# Patient Record
Sex: Female | Born: 1945 | Race: Black or African American | Hispanic: No | Marital: Single | Smoking: Current some day smoker
Health system: Southern US, Community
[De-identification: ages and names within clinical notes are randomized; demographics above are authoritative.]

## PROBLEM LIST (undated history)

## (undated) DIAGNOSIS — E785 Hyperlipidemia, unspecified: Secondary | ICD-10-CM

## (undated) DIAGNOSIS — J449 Chronic obstructive pulmonary disease, unspecified: Secondary | ICD-10-CM

## (undated) DIAGNOSIS — E559 Vitamin D deficiency, unspecified: Secondary | ICD-10-CM

## (undated) DIAGNOSIS — F0390 Unspecified dementia without behavioral disturbance: Secondary | ICD-10-CM

## (undated) HISTORY — PX: NO PAST SURGERIES: SHX2092

---

## 2016-06-26 ENCOUNTER — Emergency Department: Payer: Medicare Other

## 2016-06-26 ENCOUNTER — Emergency Department
Admission: EM | Admit: 2016-06-26 | Discharge: 2016-06-26 | Disposition: A | Discharge status: Home / Self Care | Payer: Medicare Other | Attending: Emergency Medicine | Admitting: Emergency Medicine

## 2016-06-26 ENCOUNTER — Encounter: Payer: Self-pay | Admitting: Emergency Medicine

## 2016-06-26 DIAGNOSIS — R410 Disorientation, unspecified: Secondary | ICD-10-CM | POA: Insufficient documentation

## 2016-06-26 DIAGNOSIS — N390 Urinary tract infection, site not specified: Secondary | ICD-10-CM

## 2016-06-26 DIAGNOSIS — F17200 Nicotine dependence, unspecified, uncomplicated: Secondary | ICD-10-CM | POA: Insufficient documentation

## 2016-06-26 DIAGNOSIS — Y939 Activity, unspecified: Secondary | ICD-10-CM | POA: Insufficient documentation

## 2016-06-26 DIAGNOSIS — S0081XA Abrasion of other part of head, initial encounter: Secondary | ICD-10-CM | POA: Insufficient documentation

## 2016-06-26 DIAGNOSIS — W19XXXA Unspecified fall, initial encounter: Secondary | ICD-10-CM | POA: Diagnosis not present

## 2016-06-26 DIAGNOSIS — Z7982 Long term (current) use of aspirin: Secondary | ICD-10-CM | POA: Insufficient documentation

## 2016-06-26 DIAGNOSIS — Z79899 Other long term (current) drug therapy: Secondary | ICD-10-CM | POA: Insufficient documentation

## 2016-06-26 DIAGNOSIS — Y9289 Other specified places as the place of occurrence of the external cause: Secondary | ICD-10-CM | POA: Insufficient documentation

## 2016-06-26 DIAGNOSIS — Y999 Unspecified external cause status: Secondary | ICD-10-CM | POA: Insufficient documentation

## 2016-06-26 DIAGNOSIS — S0993XA Unspecified injury of face, initial encounter: Secondary | ICD-10-CM | POA: Diagnosis present

## 2016-06-26 LAB — URINALYSIS COMPLETE WITH MICROSCOPIC (ARMC ONLY)
Bilirubin Urine: NEGATIVE
Glucose, UA: NEGATIVE mg/dL
HGB URINE DIPSTICK: NEGATIVE
Ketones, ur: NEGATIVE mg/dL
Nitrite: NEGATIVE
PH: 5 (ref 5.0–8.0)
PROTEIN: 30 mg/dL — AB
Specific Gravity, Urine: 1.012 (ref 1.005–1.030)

## 2016-06-26 LAB — BASIC METABOLIC PANEL
ANION GAP: 6 (ref 5–15)
BUN: 14 mg/dL (ref 6–20)
CALCIUM: 10.4 mg/dL — AB (ref 8.9–10.3)
CO2: 23 mmol/L (ref 22–32)
CREATININE: 1.22 mg/dL — AB (ref 0.44–1.00)
Chloride: 112 mmol/L — ABNORMAL HIGH (ref 101–111)
GFR, EST AFRICAN AMERICAN: 50 mL/min — AB (ref >60)
GFR, EST NON AFRICAN AMERICAN: 43 mL/min — AB (ref >60)
Glucose, Bld: 113 mg/dL — ABNORMAL HIGH (ref 65–99)
Potassium: 3.1 mmol/L — ABNORMAL LOW (ref 3.5–5.1)
SODIUM: 141 mmol/L (ref 135–145)

## 2016-06-26 LAB — CBC WITH DIFFERENTIAL/PLATELET
BASOS ABS: 0.1 10*3/uL (ref 0–0.1)
BASOS PCT: 2 %
EOS ABS: 0.2 10*3/uL (ref 0–0.7)
Eosinophils Relative: 2 %
HCT: 32.4 % — ABNORMAL LOW (ref 35.0–47.0)
HEMOGLOBIN: 10.9 g/dL — AB (ref 12.0–16.0)
Lymphocytes Relative: 41 %
Lymphs Abs: 3 10*3/uL (ref 1.0–3.6)
MCH: 27.8 pg (ref 26.0–34.0)
MCHC: 33.5 g/dL (ref 32.0–36.0)
MCV: 82.7 fL (ref 80.0–100.0)
MONOS PCT: 5 %
Monocytes Absolute: 0.4 10*3/uL (ref 0.2–0.9)
NEUTROS PCT: 50 %
Neutro Abs: 3.6 10*3/uL (ref 1.4–6.5)
Platelets: 346 10*3/uL (ref 150–440)
RBC: 3.92 MIL/uL (ref 3.80–5.20)
RDW: 16 % — AB (ref 11.5–14.5)
WBC: 7.3 10*3/uL (ref 3.6–11.0)

## 2016-06-26 LAB — TROPONIN I

## 2016-06-26 MED ORDER — FOSFOMYCIN TROMETHAMINE 3 G PO PACK: 3.0000 g | PACK | Freq: Once | ORAL | Status: DC

## 2016-06-26 MED ORDER — FOSFOMYCIN TROMETHAMINE 3 G PO PACK
3.0000 g | PACK | ORAL | Status: AC
  Administered 2016-06-26: 3 g via ORAL
  Filled 2016-06-26: qty 3

## 2016-06-26 NOTE — ED Provider Notes (Signed)
[facility] Emergency Department Provider Note   ____________________________________________  Time seen: Approximately 2:50pm I have reviewed the triage vital signs and the triage nursing note.  HISTORY  Chief Complaint Fall   Historian Limited Patient has dementia Ems report from nursing home memory care  HPI Candice Randall is a 70 y.o. female was sent in by EMS from memory care after an unwitnessed fall. She was reportedly found up against a wall. She was somewhat confused, although she does have a history of underlying dementia. EMS reported that she seemed a little slow to answer questions as well as confused that seemed to improve by the time she got to the ED. She has a abrasion over the bridge of her nose but does not seem to be complaining of any pain or reported to have any additional external signs of trauma.  No reported recent illnesses.    History reviewed. No pertinent past medical history. Dementia  There are no active problems to display for this patient.   History reviewed. No pertinent past surgical history.  No current outpatient prescriptions on file.  Allergies Review of patient's allergies indicates no known allergies.  History reviewed. No pertinent family history.  Social History Social History  Substance Use Topics  . Smoking status: Current Some Day Smoker  . Smokeless tobacco: None  . Alcohol Use: No    Review of Systems Limited due to patient's dementia However she denies headache, neck pain, extremity pain, vomiting, chest pain, trouble breathing ____________________________________________   PHYSICAL EXAM:  VITAL SIGNS: ED Triage Vitals  Enc Vitals Group     BP --      Pulse --      Resp --      Temp --      Temp src --      SpO2 --      Weight --      Height --      Head Cir --      Peak Flow --      Pain Score --      Pain Loc --      Pain Edu? --      Excl. in GC? --      Constitutional:  Alert and Cooperative and pleasant but disoriented and poor historian. Well appearing and in no distress. HEENT   Head: Normocephalic and atraumatic.      Eyes: Conjunctivae are normal. PERRL. Normal extraocular movements.      Ears:         Nose: No congestion/rhinnorhea. No nasal septal hematoma. Abrasion over the bridge of the nose.   Mouth/Throat: Mucous membranes are moist.   Neck: No stridor. No posterior midline C-spine step-offs or tenderness Cardiovascular/Chest: Normal rate, regular rhythm.  No murmurs, rubs, or gallops. Respiratory: Normal respiratory effort without tachypnea nor retractions. Breath sounds are clear and equal bilaterally. No wheezes/rales/rhonchi. Gastrointestinal: Soft. No distention, no guarding, no rebound. Nontender.    Genitourinary/rectal:Deferred Musculoskeletal: Pelvis stable and nontender. Nontender with normal range of motion in all extremities. No joint effusions.  No lower extremity tenderness.  No edema. Lipoma left forearm Neurologic:  Alert and conversant but disoriented. No slurred speech or facial droop. No gross or focal neurologic deficits are appreciated. Skin:  Skin is warm, dry and intact. No rash noted.  ____________________________________________   EKG I, Governor Rooksebecca Aidin Doane, MD, the attending physician have personally viewed and interpreted all ECGs.   75 bpm. Normal sinus rhythm. Narrow QRS. Normal axis. Normal  ST and T-wave ____________________________________________  LABS (pertinent positives/negatives)  Labs Reviewed  URINALYSIS COMPLETEWITH MICROSCOPIC ([facility] ONLY)  BASIC METABOLIC PANEL  CBC WITH DIFFERENTIAL/PLATELET  TROPONIN I   All are pending ____________________________________________  RADIOLOGY All Xrays were viewed by me. Imaging interpreted by Radiologist.  CT head and C-spine noncontrast: Pending __________________________________________  PROCEDURES  Procedure(s) performed: None  Critical Care  performed: None  ____________________________________________   ED COURSE / ASSESSMENT AND PLAN  Pertinent labs & imaging results that were available during my care of the patient were reviewed by me and considered in my medical decision making (see chart for details).   This patient was brought in after an unwitnessed fall and she does have dementia, and although she does not have a complaint of neck pain I placed her in a neck brace and we'll CT her head and neck.  It sounds like she was a little bit decreased with her alertness when EMS got to her raising question for potential syncope prior to fall or concussion. In either case she does not appear to have any focal neurologic deficits consistent with stroke or TIA.  From a trauma perspective, she does not appear to have evidence of any additional extremity injuries or chest, abdomen or pelvis trauma.   I will send screening laboratory studies, EKG, and urinalysis given unknown cause of fall and it was unwitnessed so potentially could've just been off balance or tripping.  Patient care transferred to Dr. Fanny BienQuale at shift change for follow-up of laboratory studies, and imaging and then disposition which I suspect may be back home.    CONSULTATIONS:   None   Patient / Family / Caregiver informed of clinical course, medical decision-making process, and agree with plan.     ___________________________________________   FINAL CLINICAL IMPRESSION(S) / ED DIAGNOSES   Final diagnoses:  Facial abrasion, initial encounter  Fall, initial encounter              Note: This dictation was prepared with Nurse, children'sDragon dictation. Any transcriptional errors that result from this process are unintentional   Governor Rooksebecca Jachai Okazaki, MD 06/26/16 1512

## 2016-06-26 NOTE — ED Provider Notes (Signed)
Patient sitting up, conversant with family. CTs demonstrate no acute abnormality. Patient awake and alert, no distress very pleasant denying any concerns right now.  Labs do reveal what appears to be a mild urinary tract infection. We will treat this with fosfomycin, and she'll be returning to her skilled nursing/assisted living facility. Family comfortable taking her home.  Return precautions and treatment recommendations and follow-up discussed with the patient who is agreeable with the plan.  CT Head Wo Contrast (Final result) Result time: 06/26/16 16:13:12   Final result by Rad Results In Interface (06/26/16 16:13:12)   Narrative:   CLINICAL DATA: Unwitnessed fall from standing. Confusion. Abrasions to the bridge of nose  EXAM: CT HEAD WITHOUT CONTRAST  CT CERVICAL SPINE WITHOUT CONTRAST  TECHNIQUE: Multidetector CT imaging of the head and cervical spine was performed following the standard protocol without intravenous contrast. Multiplanar CT image reconstructions of the cervical spine were also generated.  COMPARISON: None.  FINDINGS: CT HEAD FINDINGS  There is no evidence for acute hemorrhage, hydrocephalus, mass lesion, or abnormal extra-axial fluid collection. No definite CT evidence for acute infarction. Diffuse loss of parenchymal volume is consistent with atrophy. Patchy low attenuation in the deep hemispheric and periventricular white matter is nonspecific, but likely reflects chronic microvascular ischemic demyelination. The visualized paranasal sinuses and mastoid air cells are clear. No evidence for skull fracture.  CT CERVICAL SPINE FINDINGS  Imaging is obtained from the skullbase through the T2 vertebral body. Patient with incomplete posterior arch at C1. No evidence of fracture. Loss of disc height with endplate degeneration is seen at C4-5, C5-6, and C6-7. Trace anterolisthesis of C5 on 6 is compatible with the degree of facet disease at this level,  left greater than right. There is prominent left-sided uncinate spurring at C4-5 and C5-6. Reversal of the normal cervical lordosis is evident. No prevertebral soft tissue edema. 13 mm exophytic thyroid nodule identified posterior right thyroid lobe.  IMPRESSION: 1. No acute intracranial abnormality. 2. Atrophy with chronic small vessel white matter ischemic disease. 3. Degenerative changes in the midcervical spine without evidence for cervical spine fracture. 4. 13 mm posterior right thyroid nodule may not be clinically relevant in this individual. Thyroid ultrasound could be used to further evaluate as clinically warranted.   Electronically Signed By: Kennith CenterEric Mansell M.D. On: 06/26/2016 16:13          CT Cervical Spine Wo Contrast (Final result) Result time: 06/26/16 16:13:12   Final result by Rad Results In Interface (06/26/16 16:13:12)   Narrative:   CLINICAL DATA: Unwitnessed fall from standing. Confusion. Abrasions to the bridge of nose  EXAM: CT HEAD WITHOUT CONTRAST  CT CERVICAL SPINE WITHOUT CONTRAST  TECHNIQUE: Multidetector CT imaging of the head and cervical spine was performed following the standard protocol without intravenous contrast. Multiplanar CT image reconstructions of the cervical spine were also generated.  COMPARISON: None.  FINDINGS: CT HEAD FINDINGS  There is no evidence for acute hemorrhage, hydrocephalus, mass lesion, or abnormal extra-axial fluid collection. No definite CT evidence for acute infarction. Diffuse loss of parenchymal volume is consistent with atrophy. Patchy low attenuation in the deep hemispheric and periventricular white matter is nonspecific, but likely reflects chronic microvascular ischemic demyelination. The visualized paranasal sinuses and mastoid air cells are clear. No evidence for skull fracture.  CT CERVICAL SPINE FINDINGS  Imaging is obtained from the skullbase through the T2 vertebral body.  Patient with incomplete posterior arch at C1. No evidence of fracture. Loss of disc height with endplate  degeneration is seen at C4-5, C5-6, and C6-7. Trace anterolisthesis of C5 on 6 is compatible with the degree of facet disease at this level, left greater than right. There is prominent left-sided uncinate spurring at C4-5 and C5-6. Reversal of the normal cervical lordosis is evident. No prevertebral soft tissue edema. 13 mm exophytic thyroid nodule identified posterior right thyroid lobe.  IMPRESSION: 1. No acute intracranial abnormality. 2. Atrophy with chronic small vessel white matter ischemic disease. 3. Degenerative changes in the midcervical spine without evidence for cervical spine fracture. 4. 13 mm posterior right thyroid nodule may not be clinically relevant in this individual. Thyroid ultrasound could be used to further evaluate as clinically warranted.   Electronically Signed By: Kennith CenterEric Mansell M.D. On: 06/26/2016 16:13       Discussed with the patient and her family also incidental finding of a thyroid nodule. She has a history of previous thyroid disease, and family and the patient are agreeable to discussing and following up with her primary care doctor regarding this.  Patient will take her second dose of fosfomycin in 3 days.  She'll follow up closely with her primary care.  Return precautions and treatment recommendations and follow-up discussed with the patient who is agreeable with the plan.   Sharyn CreamerMark Quale, MD 06/26/16 [phone]

## 2016-06-26 NOTE — ED Notes (Signed)
Pt with unwitnessed fall. Per ems pt confused after fall. Abrasion bridge of nose, pt c/o burning. Pt from memory unit [facility]

## 2016-06-26 NOTE — Discharge Instructions (Signed)
Head Injury, Adult You have a head injury. Headaches and throwing up (vomiting) are common after a head injury. It should be easy to wake up from sleeping. Sometimes you must stay in the hospital. Most problems happen within the first 24 hours. Side effects may occur up to 7-10 days after the injury.  WHAT ARE THE TYPES OF HEAD INJURIES? Head injuries can be as minor as a bump. Some head injuries can be more severe. More severe head injuries include:  A jarring injury to the brain (concussion).  A bruise of the brain (contusion). This mean there is bleeding in the brain that can cause swelling.  A cracked skull (skull fracture).  Bleeding in the brain that collects, clots, and forms a bump (hematoma). WHEN SHOULD I GET HELP RIGHT AWAY?   You are confused or sleepy.  You cannot be woken up.  You feel sick to your stomach (nauseous) or keep throwing up (vomiting).  Your dizziness or unsteadiness is getting worse.  You have very bad, lasting headaches that are not helped by medicine. Take medicines only as told by your doctor.  You cannot use your arms or legs like normal.  You cannot walk.  You notice changes in the black spots in the center of the colored part of your eye (pupil).  You have clear or bloody fluid coming from your nose or ears.  You have trouble seeing. During the next 24 hours after the injury, you must stay with someone who can watch you. This person should get help right away (call 911 in the [place] if you start to shake and are not able to control it (have seizures), you pass out, or you are unable to wake up. HOW CAN I PREVENT A HEAD INJURY IN THE FUTURE?  Wear seat belts.  Wear a helmet while bike riding and playing sports like football.  Stay away from dangerous activities around the house. WHEN CAN I RETURN TO NORMAL ACTIVITIES AND ATHLETICS? See your doctor before doing these activities. You should not do normal activities or play contact sports until 1  week after the following symptoms have stopped:  Headache that does not go away.  Dizziness.  Poor attention.  Confusion.  Memory problems.  Sickness to your stomach or throwing up.  Tiredness.  Fussiness.  Bothered by bright lights or loud noises.  Anxiousness or depression.  Restless sleep. MAKE SURE YOU:   Understand these instructions.  Will watch your condition.  Will get help right away if you are not doing well or get worse.   This information is not intended to replace advice given to you by your health care provider. Make sure you discuss any questions you have with your health care provider.   Document Released: 11/24/2008 Document Revised: 01/02/2015 Document Reviewed: 08/19/2013 Elsevier Interactive Patient Education 2016 Elsevier Inc.  Urinary Tract Infection Urinary tract infections (UTIs) can develop anywhere along your urinary tract. Your urinary tract is your body's drainage system for removing wastes and extra water. Your urinary tract includes two kidneys, two ureters, a bladder, and a urethra. Your kidneys are a pair of bean-shaped organs. Each kidney is about the size of your fist. They are located below your ribs, one on each side of your spine. CAUSES Infections are caused by microbes, which are microscopic organisms, including fungi, viruses, and bacteria. These organisms are so small that they can only be seen through a microscope. Bacteria are the microbes that most commonly cause UTIs. SYMPTOMS  Symptoms  of UTIs may vary by age and gender of the patient and by the location of the infection. Symptoms in young women typically include a frequent and intense urge to urinate and a painful, burning feeling in the bladder or urethra during urination. Older women and men are more likely to be tired, shaky, and weak and have muscle aches and abdominal pain. A fever may mean the infection is in your kidneys. Other symptoms of a kidney infection include pain  in your back or sides below the ribs, nausea, and vomiting. DIAGNOSIS To diagnose a UTI, your caregiver will ask you about your symptoms. Your caregiver will also ask you to provide a urine sample. The urine sample will be tested for bacteria and white blood cells. White blood cells are made by your body to help fight infection. TREATMENT  Typically, UTIs can be treated with medication. Because most UTIs are caused by a bacterial infection, they usually can be treated with the use of antibiotics. The choice of antibiotic and length of treatment depend on your symptoms and the type of bacteria causing your infection. HOME CARE INSTRUCTIONS  If you were prescribed antibiotics, take them exactly as your caregiver instructs you. Finish the medication even if you feel better after you have only taken some of the medication.  Drink enough water and fluids to keep your urine clear or pale yellow.  Avoid caffeine, tea, and carbonated beverages. They tend to irritate your bladder.  Empty your bladder often. Avoid holding urine for long periods of time.  Empty your bladder before and after sexual intercourse.  After a bowel movement, women should cleanse from front to back. Use each tissue only once. SEEK MEDICAL CARE IF:   You have back pain.  You develop a fever.  Your symptoms do not begin to resolve within 3 days. SEEK IMMEDIATE MEDICAL CARE IF:   You have severe back pain or lower abdominal pain.  You develop chills.  You have nausea or vomiting.  You have continued burning or discomfort with urination. MAKE SURE YOU:   Understand these instructions.  Will watch your condition.  Will get help right away if you are not doing well or get worse.   This information is not intended to replace advice given to you by your health care provider. Make sure you discuss any questions you have with your health care provider.   Document Released: 09/21/2005 Document Revised: 09/02/2015  Document Reviewed: 01/20/2012 Elsevier Interactive Patient Education [employer]2016 Elsevier Inc.

## 2016-07-28 ENCOUNTER — Emergency Department: Payer: Medicare Other

## 2016-07-28 ENCOUNTER — Encounter: Payer: Self-pay | Admitting: Emergency Medicine

## 2016-07-28 ENCOUNTER — Observation Stay
Admission: EM | Admit: 2016-07-28 | Discharge: 2016-07-29 | Disposition: A | Discharge status: Skilled Nursing Facility | Payer: Medicare Other | Attending: Internal Medicine | Admitting: Internal Medicine

## 2016-07-28 DIAGNOSIS — J449 Chronic obstructive pulmonary disease, unspecified: Secondary | ICD-10-CM | POA: Diagnosis not present

## 2016-07-28 DIAGNOSIS — I493 Ventricular premature depolarization: Secondary | ICD-10-CM | POA: Insufficient documentation

## 2016-07-28 DIAGNOSIS — I10 Essential (primary) hypertension: Secondary | ICD-10-CM | POA: Diagnosis not present

## 2016-07-28 DIAGNOSIS — E559 Vitamin D deficiency, unspecified: Secondary | ICD-10-CM | POA: Diagnosis not present

## 2016-07-28 DIAGNOSIS — Z79899 Other long term (current) drug therapy: Secondary | ICD-10-CM | POA: Diagnosis not present

## 2016-07-28 DIAGNOSIS — E872 Acidosis, unspecified: Secondary | ICD-10-CM

## 2016-07-28 DIAGNOSIS — R5383 Other fatigue: Secondary | ICD-10-CM | POA: Insufficient documentation

## 2016-07-28 DIAGNOSIS — Z7982 Long term (current) use of aspirin: Secondary | ICD-10-CM | POA: Insufficient documentation

## 2016-07-28 DIAGNOSIS — E785 Hyperlipidemia, unspecified: Secondary | ICD-10-CM | POA: Diagnosis not present

## 2016-07-28 DIAGNOSIS — E860 Dehydration: Secondary | ICD-10-CM | POA: Diagnosis not present

## 2016-07-28 DIAGNOSIS — F0390 Unspecified dementia without behavioral disturbance: Secondary | ICD-10-CM | POA: Insufficient documentation

## 2016-07-28 DIAGNOSIS — I700 Atherosclerosis of aorta: Secondary | ICD-10-CM | POA: Insufficient documentation

## 2016-07-28 DIAGNOSIS — N179 Acute kidney failure, unspecified: Secondary | ICD-10-CM | POA: Diagnosis not present

## 2016-07-28 DIAGNOSIS — F17200 Nicotine dependence, unspecified, uncomplicated: Secondary | ICD-10-CM | POA: Diagnosis not present

## 2016-07-28 DIAGNOSIS — I950 Idiopathic hypotension: Secondary | ICD-10-CM | POA: Insufficient documentation

## 2016-07-28 HISTORY — DX: Chronic obstructive pulmonary disease, unspecified: J44.9

## 2016-07-28 HISTORY — DX: Hyperlipidemia, unspecified: E78.5

## 2016-07-28 HISTORY — DX: Vitamin D deficiency, unspecified: E55.9

## 2016-07-28 HISTORY — DX: Unspecified dementia, unspecified severity, without behavioral disturbance, psychotic disturbance, mood disturbance, and anxiety: F03.90

## 2016-07-28 LAB — CBC WITH DIFFERENTIAL/PLATELET
BASOS ABS: 0 10*3/uL (ref 0–0.1)
Basophils Relative: 0 %
Eosinophils Absolute: 0 10*3/uL (ref 0–0.7)
Eosinophils Relative: 1 %
HCT: 37.8 % (ref 35.0–47.0)
Hemoglobin: 12.5 g/dL (ref 12.0–16.0)
LYMPHS ABS: 1.8 10*3/uL (ref 1.0–3.6)
LYMPHS PCT: 26 %
MCH: 27 pg (ref 26.0–34.0)
MCHC: 33 g/dL (ref 32.0–36.0)
MCV: 81.8 fL (ref 80.0–100.0)
MONO ABS: 0.8 10*3/uL (ref 0.2–0.9)
Monocytes Relative: 11 %
NEUTROS ABS: 4.5 10*3/uL (ref 1.4–6.5)
Neutrophils Relative %: 62 %
Platelets: 319 10*3/uL (ref 150–440)
RBC: 4.63 MIL/uL (ref 3.80–5.20)
RDW: 16.2 % — AB (ref 11.5–14.5)
WBC: 7.1 10*3/uL (ref 3.6–11.0)

## 2016-07-28 LAB — LACTIC ACID, PLASMA
LACTIC ACID, VENOUS: 0.8 mmol/L (ref 0.5–1.9)
Lactic Acid, Venous: 1.9 mmol/L (ref 0.5–1.9)

## 2016-07-28 LAB — COMPREHENSIVE METABOLIC PANEL
ALBUMIN: 3.7 g/dL (ref 3.5–5.0)
ALT: 12 U/L — ABNORMAL LOW (ref 14–54)
ANION GAP: 11 (ref 5–15)
AST: 24 U/L (ref 15–41)
Alkaline Phosphatase: 144 U/L — ABNORMAL HIGH (ref 38–126)
BUN: 61 mg/dL — ABNORMAL HIGH (ref 6–20)
CHLORIDE: 112 mmol/L — AB (ref 101–111)
CO2: 18 mmol/L — AB (ref 22–32)
Calcium: 10.3 mg/dL (ref 8.9–10.3)
Creatinine, Ser: 2.03 mg/dL — ABNORMAL HIGH (ref 0.44–1.00)
GFR calc Af Amer: 27 mL/min — ABNORMAL LOW (ref >60)
GFR calc non Af Amer: 23 mL/min — ABNORMAL LOW (ref >60)
GLUCOSE: 94 mg/dL (ref 65–99)
POTASSIUM: 4.8 mmol/L (ref 3.5–5.1)
Sodium: 141 mmol/L (ref 135–145)
TOTAL PROTEIN: 8.2 g/dL — AB (ref 6.5–8.1)
Total Bilirubin: 0.7 mg/dL (ref 0.3–1.2)

## 2016-07-28 LAB — URINALYSIS COMPLETE WITH MICROSCOPIC (ARMC ONLY)
Bilirubin Urine: NEGATIVE
GLUCOSE, UA: NEGATIVE mg/dL
HGB URINE DIPSTICK: NEGATIVE
Ketones, ur: NEGATIVE mg/dL
Leukocytes, UA: NEGATIVE
NITRITE: NEGATIVE
PROTEIN: NEGATIVE mg/dL
Specific Gravity, Urine: 1.012 (ref 1.005–1.030)
pH: 5 (ref 5.0–8.0)

## 2016-07-28 MED ORDER — ENOXAPARIN SODIUM 40 MG/0.4ML ~~LOC~~ SOLN: 40.0000 mg | SUBCUTANEOUS | Status: DC

## 2016-07-28 MED ORDER — DOCUSATE SODIUM 100 MG PO CAPS
100.0000 mg | ORAL_CAPSULE | Freq: Two times a day (BID) | ORAL | Status: DC
  Administered 2016-07-28: 100 mg via ORAL
  Filled 2016-07-28: qty 1

## 2016-07-28 MED ORDER — ONDANSETRON HCL 4 MG/2ML IJ SOLN: 4.0000 mg | Freq: Four times a day (QID) | INTRAMUSCULAR | Status: DC | PRN

## 2016-07-28 MED ORDER — ACETAMINOPHEN 325 MG PO TABS: 650.0000 mg | ORAL_TABLET | Freq: Four times a day (QID) | ORAL | Status: DC | PRN

## 2016-07-28 MED ORDER — ASPIRIN EC 81 MG PO TBEC: 81.0000 mg | DELAYED_RELEASE_TABLET | Freq: Every day | ORAL | Status: DC

## 2016-07-28 MED ORDER — ACETAMINOPHEN 650 MG RE SUPP: 650.0000 mg | Freq: Four times a day (QID) | RECTAL | Status: DC | PRN

## 2016-07-28 MED ORDER — ALBUTEROL SULFATE (2.5 MG/3ML) 0.083% IN NEBU: 2.5000 mg | INHALATION_SOLUTION | RESPIRATORY_TRACT | Status: DC | PRN

## 2016-07-28 MED ORDER — ONDANSETRON HCL 4 MG PO TABS: 4.0000 mg | ORAL_TABLET | Freq: Four times a day (QID) | ORAL | Status: DC | PRN

## 2016-07-28 MED ORDER — SODIUM CHLORIDE 0.9 % IV SOLN
INTRAVENOUS | Status: DC
  Administered 2016-07-28 – 2016-07-29 (×2): via INTRAVENOUS

## 2016-07-28 MED ORDER — PRAVASTATIN SODIUM 10 MG PO TABS
10.0000 mg | ORAL_TABLET | Freq: Every day | ORAL | Status: DC
  Administered 2016-07-28: 10 mg via ORAL
  Filled 2016-07-28: qty 1

## 2016-07-28 MED ORDER — SODIUM CHLORIDE 0.9 % IV BOLUS (SEPSIS)
1000.0000 mL | Freq: Once | INTRAVENOUS | Status: AC
  Administered 2016-07-28: 1000 mL via INTRAVENOUS

## 2016-07-28 MED ORDER — ENOXAPARIN SODIUM 40 MG/0.4ML ~~LOC~~ SOLN: 30.0000 mg | SUBCUTANEOUS | Status: DC

## 2016-07-28 MED ORDER — FOLIC ACID 1 MG PO TABS: 1.0000 mg | ORAL_TABLET | Freq: Every day | ORAL | Status: DC

## 2016-07-28 MED ORDER — POLYETHYLENE GLYCOL 3350 17 G PO PACK: 17.0000 g | PACK | Freq: Every day | ORAL | Status: DC | PRN

## 2016-07-28 MED ORDER — OXYCODONE HCL 5 MG PO TABS: 5.0000 mg | ORAL_TABLET | ORAL | Status: DC | PRN

## 2016-07-28 NOTE — Progress Notes (Signed)
Patient was admitted to room 145 from ER via cart and nurse tech from ER.  2 IVs in place. Tele and IV fluids started. Patient is alert to self only, answered some questions. History of Dementia. Bed alarm on for safety.

## 2016-07-28 NOTE — ED Notes (Signed)
Carollee Herter, RN to bedside to attempt an IV at this time. Will continue to monitor for further patient needs.

## 2016-07-28 NOTE — ED Notes (Signed)
Report called to Jamie, RN

## 2016-07-28 NOTE — ED Provider Notes (Signed)
[facility] Emergency Department Provider Note    First MD Initiated Contact with Patient 07/28/16 1200     (approximate)  I have reviewed the triage vital signs and the nursing notes.   HISTORY  Chief Complaint Hypotension    HPI Candice Randall is a 70 y.o. female  who is pleasantly demented presenting from nursing facility memory care center with report of dehydration and low blood pressure. Patient poorly had decreased by mouth intake. Staff physician saw her today and was also concern for urinary tract infection. History is limited due to her underlying dementia at this time patient responds to all questions with " Dr. Roxan Hockey."      Past Medical History:  Diagnosis Date  . COPD (chronic obstructive pulmonary disease) (HCC)   . Dementia   . Hyperlipidemia   . Vitamin D deficiency     There are no active problems to display for this patient.   History reviewed. No pertinent surgical history.  Prior to Admission medications   Medication Sig Start Date End Date Taking? Authorizing Provider  aspirin EC 81 MG tablet Take 81 mg by mouth daily.    Historical Provider, MD  cholecalciferol (VITAMIN D) 1000 units tablet Take 1,000 Units by mouth daily.    Historical Provider, MD  folic acid (FOLVITE) 1 MG tablet Take 1 mg by mouth daily.    Historical Provider, MD  fosfomycin (MONUROL) 3 g PACK Take 3 g by mouth once. Please mix in 8 oz of water, take by mouth once 06/26/16   Sharyn Creamer, MD  pravastatin (PRAVACHOL) 10 MG tablet Take 10 mg by mouth at bedtime.    Historical Provider, MD    Allergies Review of patient's allergies indicates no known allergies.  History reviewed. No pertinent family history.  Social History Social History  Substance Use Topics  . Smoking status: Current Some Day Smoker  . Smokeless tobacco: Never Used  . Alcohol use No    Review of Systems Unable to obtain due to AMS,  dementia ____________________________________________   PHYSICAL EXAM:  VITAL SIGNS: Vitals:   07/28/16 1213  BP: 105/77  Pulse: 98  Resp: (!) 21  Temp: 97.6 F (36.4 C)    Constitutional: Alert and disoriented x3. Chronically ill appearing. Eyes: Conjunctivae are normal. PERRL. EOMI. Head: Atraumatic. Nose: No congestion/rhinnorhea. Mouth/Throat: Mucous membranes dry  Oropharynx non-erythematous. Neck: No stridor. Painless ROM. No cervical spine tenderness to palpation Cardiovascular: Normal rate, regular rhythm. Grossly normal heart sounds.  Good peripheral circulation. Respiratory: Normal respiratory effort.  No retractions. Lungs CTAB. Gastrointestinal: Soft and nontender. No distention. No abdominal bruits. No CVA tenderness. Genitourinary:  Musculoskeletal: No lower extremity tenderness nor edema.  No joint effusions. Neurologic:  Normal speech and language. No gross focal neurologic deficits are appreciated. No gait instability. Skin:  Skin is warm,dry, skin tenting presnt. No rash noted.   ____________________________________________   LABS (all labs ordered are listed, but only abnormal results are displayed)  Results for orders placed or performed during the hospital encounter of 07/28/16 (from the past 24 hour(s))  Comprehensive metabolic panel     Status: Abnormal   Collection Time: 07/28/16 12:22 PM  Result Value Ref Range   Sodium 141 135 - 145 mmol/L   Potassium 4.8 3.5 - 5.1 mmol/L   Chloride 112 (H) 101 - 111 mmol/L   CO2 18 (L) 22 - 32 mmol/L   Glucose, Bld 94 65 - 99 mg/dL   BUN 61 (H) 6 -  20 mg/dL   Creatinine, Ser 1.61 (H) 0.44 - 1.00 mg/dL   Calcium 09.6 8.9 - 04.5 mg/dL   Total Protein 8.2 (H) 6.5 - 8.1 g/dL   Albumin 3.7 3.5 - 5.0 g/dL   AST 24 15 - 41 U/L   ALT 12 (L) 14 - 54 U/L   Alkaline Phosphatase 144 (H) 38 - 126 U/L   Total Bilirubin 0.7 0.3 - 1.2 mg/dL   GFR calc non Af Amer 23 (L) >60 mL/min   GFR calc Af Amer 27 (L) >60 mL/min    Anion gap 11 5 - 15  CBC WITH DIFFERENTIAL     Status: Abnormal   Collection Time: 07/28/16 12:22 PM  Result Value Ref Range   WBC 7.1 3.6 - 11.0 K/uL   RBC 4.63 3.80 - 5.20 MIL/uL   Hemoglobin 12.5 12.0 - 16.0 g/dL   HCT 40.9 81.1 - 91.4 %   MCV 81.8 80.0 - 100.0 fL   MCH 27.0 26.0 - 34.0 pg   MCHC 33.0 32.0 - 36.0 g/dL   RDW 78.2 (H) 95.6 - 21.3 %   Platelets 319 150 - 440 K/uL   Neutrophils Relative % 62 %   Neutro Abs 4.5 1.4 - 6.5 K/uL   Lymphocytes Relative 26 %   Lymphs Abs 1.8 1.0 - 3.6 K/uL   Monocytes Relative 11 %   Monocytes Absolute 0.8 0.2 - 0.9 K/uL   Eosinophils Relative 1 %   Eosinophils Absolute 0.0 0 - 0.7 K/uL   Basophils Relative 0 %   Basophils Absolute 0.0 0 - 0.1 K/uL  Urinalysis complete, with microscopic ([facility] only)     Status: Abnormal   Collection Time: 07/28/16 12:22 PM  Result Value Ref Range   Color, Urine YELLOW (A) YELLOW   APPearance CLEAR (A) CLEAR   Glucose, UA NEGATIVE NEGATIVE mg/dL   Bilirubin Urine NEGATIVE NEGATIVE   Ketones, ur NEGATIVE NEGATIVE mg/dL   Specific Gravity, Urine 1.012 1.005 - 1.030   Hgb urine dipstick NEGATIVE NEGATIVE   pH 5.0 5.0 - 8.0   Protein, ur NEGATIVE NEGATIVE mg/dL   Nitrite NEGATIVE NEGATIVE   Leukocytes, UA NEGATIVE NEGATIVE   RBC / HPF 0-5 0 - 5 RBC/hpf   WBC, UA 0-5 0 - 5 WBC/hpf   Bacteria, UA RARE (A) NONE SEEN   Squamous Epithelial / LPF 0-5 (A) NONE SEEN   Mucous PRESENT    Hyaline Casts, UA PRESENT    ____________________________________________  EKG  Time: 12:12  Indication: hypoetnsion  Rate: 95  Rhythm: NSR Axis: normal Other: nonspecific ST changes ____________________________________________  RADIOLOGY  CXR @times @ my read shows no evidence of acute cardiopulmonary process.  ____________________________________________   PROCEDURES  Procedure(s) performed: none    Critical Care performed: no ____________________________________________   INITIAL IMPRESSION /  ASSESSMENT AND PLAN / ED COURSE  Pertinent labs & imaging results that were available during my care of the patient were reviewed by me and considered in my medical decision making (see chart for details).  DDX: sepsis, dehydration, electrolyte abnormality, uti, anemia, failure to thrive  Candice Randall is a 70 y.o. who presents to the ED with report of hypotension and decreased by mouth intake was hypotension at the nursing facility. Patient is afebrile and hemodynamically stable does have borderline low blood pressure. We'll order IV fluids as well as check labs due to concern for joint abnormality. We'll check chest x-ray due to concern for pneumonia.  Clinical Course  Comment By  Time  Market metabolic acidosis noted with evidence of prerenal azotemia as a dizziness patient does not have fever. Blood pressure improving with IV fluids. Willy Eddy, MD 08/03 1313    ----------------------------------------- 1:32 PM on 07/28/2016 -----------------------------------------  Patient with metabolic acidosis and prerenal azotemia on laboratory evaluation. No leukocytosis and no fever therefore do not feel antibiotics indicated at this time. Have given IV fluids but based on the derangement of her electrolytes and her frailty factors do feel patient will require admission to the hospital for further IV fluid resuscitation and hemodynamic monitoring.  Have discussed with the patient and available family all diagnostics and treatments performed thus far and all questions were answered to the best of my ability. The patient demonstrates understanding and agreement with plan.  ____________________________________________   FINAL CLINICAL IMPRESSION(S) / ED DIAGNOSES  Final diagnoses:  Metabolic acidosis  Dehydration  Idiopathic hypotension      NEW MEDICATIONS STARTED DURING THIS VISIT:  New Prescriptions   No medications on file     Note:  This document was prepared using Dragon  voice recognition software and may include unintentional dictation errors.     Willy Eddy, MD 07/28/16 [phone]

## 2016-07-28 NOTE — ED Triage Notes (Signed)
Pt presents to ED via [facility] from [facility] care. Per EMS they were dispatched for hypotension with a BP 70/40. Per EMS BP 96/54 on their arrival. Pt has a hx of dementia, and presents as pleasantly confused at this time.

## 2016-07-28 NOTE — ED Notes (Signed)
Pt resting in bed at this time. NAD Noted. Respirations even and unlabored at this time.

## 2016-07-28 NOTE — Progress Notes (Signed)
Pt will be admitted to room 145. Chip Boer has been notified and states they will come see pt tomorrow. Medical social worker also notified.   Jonathon Jordan, MSW, Theresia Majors [phone]

## 2016-07-28 NOTE — H&P (Signed)
[facility] - [place] [facility]   PATIENT NAME: Candice Randall    MR#:  [id number]  DATE OF BIRTH:  Aug 10, 1946  DATE OF ADMISSION:  07/28/2016  PRIMARY CARE PHYSICIAN: Pcp Not In System   REQUESTING/REFERRING PHYSICIAN: Dr. Roxan Hockey  CHIEF COMPLAINT:   Chief Complaint  Patient presents with  . Hypotension    HISTORY OF PRESENT ILLNESS:  Candice Randall  is a 70 y.o. female with a known history of Dementia, COPD presents to the emergency room from local nursing homes memory unit due to being more confused and lethargic. Patient was also found to have hypertension at 70/44. Blood pressure has improved with IV fluids and a systolic of 90s. Patient is poor historian due to dementia and is presently confused. Unable to contribute to history. History obtained from old records and discussing with emergency room staff. Nursing home notes reviewed. She does not have a UTI or pneumonia. No leukocytosis. Blood work showed acute kidney injury with creatinine increasing from 1.2 --> 2. Patient looks severely dehydrated.  PAST MEDICAL HISTORY:   Past Medical History:  Diagnosis Date  . COPD (chronic obstructive pulmonary disease) (HCC)   . Dementia   . Hyperlipidemia   . Vitamin D deficiency     PAST SURGICAL HISTORY:  History reviewed. No pertinent surgical history.  SOCIAL HISTORY:   Social History  Substance Use Topics  . Smoking status: Current Some Day Smoker  . Smokeless tobacco: Never Used  . Alcohol use No    FAMILY HISTORY:  History reviewed. No pertinent family history.  DRUG ALLERGIES:  No Known Allergies  REVIEW OF SYSTEMS:   Review of Systems  Unable to perform ROS: Dementia    MEDICATIONS AT HOME:   Prior to Admission medications   Medication Sig Start Date End Date Taking? Authorizing Provider  aspirin EC 81 MG tablet Take 81 mg by mouth daily.   Yes Historical Provider, MD  cholecalciferol (VITAMIN D) 1000 units tablet Take  1,000 Units by mouth daily.   Yes Historical Provider, MD  folic acid (FOLVITE) 1 MG tablet Take 1 mg by mouth daily.   Yes Historical Provider, MD  fosfomycin (MONUROL) 3 g PACK Take 3 g by mouth once. Please mix in 8 oz of water, take by mouth once 06/26/16  Yes Sharyn Creamer, MD  pravastatin (PRAVACHOL) 10 MG tablet Take 10 mg by mouth at bedtime.   Yes Historical Provider, MD     VITAL SIGNS:  Blood pressure 105/77, pulse 98, temperature 97.6 F (36.4 C), temperature source Oral, resp. rate (!) 21, height 5\' 6"  (1.676 m), weight 59 kg (130 lb), SpO2 95 %.  PHYSICAL EXAMINATION:  Physical Exam  GENERAL:  70 y.o.-year-old patient lying in the bed with no acute distress.  EYES: Pupils equal, round, reactive to light and accommodation. No scleral icterus. Extraocular muscles intact.  HEENT: Head atraumatic, normocephalic. Oropharynx and nasopharynx clear. No oropharyngeal erythema, dry oral mucosa  NECK:  Supple, no jugular venous distention. No thyroid enlargement, no tenderness.  LUNGS: Normal breath sounds bilaterally, no wheezing, rales, rhonchi. No use of accessory muscles of respiration.  CARDIOVASCULAR: S1, S2 normal. No murmurs, rubs, or gallops.  ABDOMEN: Soft, nontender, nondistended. Bowel sounds present. No organomegaly or mass.  EXTREMITIES: No pedal edema, cyanosis, or clubbing. + 2 pedal & radial pulses b/l.   NEUROLOGIC: Cranial nerves II through XII are intact. No focal Motor or sensory deficits appreciated b/l PSYCHIATRIC: The patient is alert and  awake. Pleasantly confused. SKIN: No obvious rash, lesion, or ulcer.   LABORATORY PANEL:   CBC  Recent Labs Lab 07/28/16 1222  WBC 7.1  HGB 12.5  HCT 37.8  PLT 319   ------------------------------------------------------------------------------------------------------------------  Chemistries   Recent Labs Lab 07/28/16 1222  NA 141  K 4.8  CL 112*  CO2 18*  GLUCOSE 94  BUN 61*  CREATININE 2.03*  CALCIUM 10.3   AST 24  ALT 12*  ALKPHOS 144*  BILITOT 0.7   ------------------------------------------------------------------------------------------------------------------  Cardiac Enzymes No results for input(s): TROPONINI in the last 168 hours. ------------------------------------------------------------------------------------------------------------------  RADIOLOGY:  Dg Chest 2 View  Result Date: 07/28/2016 CLINICAL DATA:  Dementia EXAM: CHEST  2 VIEW COMPARISON:  01/30/2013 FINDINGS: Heart size is normal. Aortic atherosclerosis. The lungs are clear. No heart failure or effusion. No acute bone finding. IMPRESSION: No active disease.  Aortic atherosclerosis. Electronically Signed   By: Paulina Fusi M.D.   On: 07/28/2016 13:14     IMPRESSION AND PLAN:   * Dehydration due to poor oral intake causing hypotension No pneumonia on chest x-ray. Urinalysis showed no signs of UTI. No other source of infection found. Will start IV fluids. Patient likely has poor oral intake due to worsening dementia. We'll consult palliative care for goals of care.  * Acute kidney injury due to decreased oral intake and dehydration IV fluids. Monitor input and output.  * COPD is stable. Nebulizers when necessary. Not needing oxygen.  * Dementia Patient is pleasantly confused. Watch for inpatient delirium.  * DVT prophylaxis with Lovenox.  All the records are reviewed and case discussed with ED provider. Management plans discussed with the patient, family and they are in agreement.  CODE STATUS: FULL CODE  TOTAL TIME TAKING CARE OF THIS PATIENT: 40 minutes.   Milagros Loll R M.D on 07/28/2016 at 2:07 PM  Between 7am to 6pm - Pager - [phone]  After 6pm go to [email or web address] - password [email or web address]  [facility]  Office  [phone]  CC: Primary care physician; Pcp Not In System  Note: This dictation was prepared with Dragon dictation along with smaller phrase technology. Any  transcriptional errors that result from this process are unintentional.

## 2016-07-28 NOTE — Clinical Social Work Note (Signed)
Clinical Social Work Assessment  Patient Details  Name: Candice Randall MRN: [id number] Date of Birth: November 09, 1946  Date of referral:  07/28/16               Reason for consult:  Discharge Planning                Permission sought to share information with:  Facility Medical sales representative Permission granted to share information::  Yes, Verbal Permission Granted  Name::     [facility] [phone] and Daughter HPOA  Delight Stare [phone]  Agency::     Relationship::     Contact Information:     Housing/Transportation Living arrangements for the past 2 months:  Assisted Living Facility (Memory Care) Source of Information:  Patient, Facility Patient Interpreter Needed:  None Criminal Activity/Legal Involvement Pertinent to Current Situation/Hospitalization:  No - Comment as needed Significant Relationships:  Adult Children, Other Family Members, Community Support Lives with:  Facility Resident Do you feel safe going back to the place where you live?  Yes Need for family participation in patient care:  Yes (Comment) (Daughter is HPOA)  Care giving concerns: No care giving concerns identified at this time.   Social Worker assessment / plan: CSW received consult for discharge planning. Pt is from [facility] and was sent to the ED for concerns of hypotension. CSW engaged with pt at her bedside and introduced herself and her role as a Child psychotherapist. Pt was pleasant although slightly disoriented. Pt states that she enjoys living at [facility] and feels safe there. CSW received additional information from [facility] [place]. Corrie Dandy states that at baseline pt is generally confused and needs prompts and reminders throughout the day. Pt ambulates on her own and is independent for feeding and dressing. CSW informed facility that pt will be admitted although room number is unclear at this time. Pt can return to the facility upon d/c.    Employment status:  Retired Health and safety inspector:  Medicare PT Recommendations:  Not assessed at this time Information / Referral to community resources:   (Assisted Living-Memory Care)  Patient/Family's Response to care: Pt can return to facility upon d/c.  Patient/Family's Understanding of and Emotional Response to Diagnosis, Current Treatment, and Prognosis:  Pt is appreciative of the care provided by CSW at this time.  Emotional Assessment Appearance:  Appears stated age Attitude/Demeanor/Rapport:  Other (Cooperative) Affect (typically observed):  Calm, Pleasant Orientation:  Oriented to Self, Fluctuating Orientation (Suspected and/or reported Sundowners) Alcohol / Substance use:  Not Applicable Psych involvement (Current and /or in the community):  No (Comment)  Discharge Needs  Concerns to be addressed:  No discharge needs identified Readmission within the last 30 days:  No Current discharge risk:  Cognitively Impaired Barriers to Discharge:  No Barriers Identified   Jonathon Jordan, LCSWA 07/28/2016, 2:59 PM

## 2016-07-29 DIAGNOSIS — E860 Dehydration: Secondary | ICD-10-CM | POA: Diagnosis not present

## 2016-07-29 LAB — CBC
HEMATOCRIT: 32.9 % — AB (ref 35.0–47.0)
Hemoglobin: 11.3 g/dL — ABNORMAL LOW (ref 12.0–16.0)
MCH: 27.8 pg (ref 26.0–34.0)
MCHC: 34.3 g/dL (ref 32.0–36.0)
MCV: 81.1 fL (ref 80.0–100.0)
PLATELETS: 286 10*3/uL (ref 150–440)
RBC: 4.06 MIL/uL (ref 3.80–5.20)
RDW: 16 % — AB (ref 11.5–14.5)
WBC: 6.9 10*3/uL (ref 3.6–11.0)

## 2016-07-29 LAB — BASIC METABOLIC PANEL
Anion gap: 8 (ref 5–15)
BUN: 46 mg/dL — AB (ref 6–20)
CHLORIDE: 121 mmol/L — AB (ref 101–111)
CO2: 17 mmol/L — AB (ref 22–32)
CREATININE: 1.37 mg/dL — AB (ref 0.44–1.00)
Calcium: 9.6 mg/dL (ref 8.9–10.3)
GFR calc Af Amer: 43 mL/min — ABNORMAL LOW (ref >60)
GFR calc non Af Amer: 38 mL/min — ABNORMAL LOW (ref >60)
GLUCOSE: 83 mg/dL (ref 65–99)
POTASSIUM: 3.7 mmol/L (ref 3.5–5.1)
SODIUM: 146 mmol/L — AB (ref 135–145)

## 2016-07-29 LAB — URINE CULTURE: CULTURE: NO GROWTH

## 2016-07-29 MED ORDER — ENOXAPARIN SODIUM 40 MG/0.4ML ~~LOC~~ SOLN: 40.0000 mg | SUBCUTANEOUS | Status: DC

## 2016-07-29 NOTE — NC FL2 (Signed)
  [place] MEDICAID FL2 LEVEL OF CARE SCREENING TOOL     IDENTIFICATION  Patient Name: Candice Randall Birthdate: 14-Jun-1946 Sex: female Admission Date (Current Location): 07/28/2016  [facility] and [place] Number:  [address]  ([id number]) Facility and Address:  [facility], [address], [place], [place] [address]      Provider Number: [id number]  Attending Physician Name and Address:  Adrian Saran, MD  Relative Name and Phone Number:       Current Level of Care: Hospital Recommended Level of Care: Assisted Living Facility, Memory Care Prior Approval Number:    Date Approved/Denied:   PASRR Number:  ( [id number] )  Discharge Plan: Domiciliary (Rest home)    Current Diagnoses: Primary: Dementia  Patient Active Problem List   Diagnosis Date Noted  . Dehydration 07/28/2016    Orientation RESPIRATION BLADDER Height & Weight     Self  Normal Continent Weight: 129 lb 8 oz (58.7 kg) Height:  5\' 5"  (165.1 cm)  BEHAVIORAL SYMPTOMS/MOOD NEUROLOGICAL BOWEL NUTRITION STATUS   (none )  (none ) Continent Diet (Diet: Regualr )  AMBULATORY STATUS COMMUNICATION OF NEEDS Skin   Supervision Verbally Normal                       Personal Care Assistance Level of Assistance  Bathing, Feeding, Dressing Bathing Assistance: Limited assistance Feeding assistance: Limited assistance Dressing Assistance: Limited assistance     Functional Limitations Info  Sight, Hearing, Speech Sight Info: Adequate Hearing Info: Adequate Speech Info: Adequate    SPECIAL CARE FACTORS FREQUENCY                       Contractures      Additional Factors Info  Code Status, Allergies Code Status Info:  (Full Code. ) Allergies Info:  (No Known Allergies. )          Discharge Medications: Please see discharge summary for a list of discharge medications. Current Discharge Medication List       CONTINUE these medications which have NOT CHANGED   Details   aspirin EC 81 MG tablet Take 81 mg by mouth daily.    cholecalciferol (VITAMIN D) 1000 units tablet Take 1,000 Units by mouth daily.    folic acid (FOLVITE) 1 MG tablet Take 1 mg by mouth daily.    fosfomycin (MONUROL) 3 g PACK Take 3 g by mouth once. Please mix in 8 oz of water, take by mouth once Qty: 3 g, Refills: 0    pravastatin (PRAVACHOL) 10 MG tablet Take 10 mg by mouth at bedtime.      Relevant Imaging Results: Relevant Lab Results: Additional Information  (SSN: [id number])  Kamari Buch, Darleen Crocker, LCSW

## 2016-07-29 NOTE — Discharge Summary (Signed)
[facility] [place] at [facility]   PATIENT NAME: Candice Randall    MR#:  [id number]  DATE OF BIRTH:  [birth year]  DATE OF ADMISSION:  07/28/2016 ADMITTING PHYSICIAN: Milagros Loll, MD  DATE OF DISCHARGE: 07/29/2016  PRIMARY CARE PHYSICIAN: Pcp Not In System    ADMISSION DIAGNOSIS:  Dehydration [E86.0] Metabolic acidosis [E87.2] Idiopathic hypotension [I95.0]  DISCHARGE DIAGNOSIS:  Active Problems:   Dehydration   SECONDARY DIAGNOSIS:   Past Medical History:  Diagnosis Date  . COPD (chronic obstructive pulmonary disease) (HCC)   . Dementia   . Hyperlipidemia   . Vitamin D deficiency     HOSPITAL COURSE:  70 y.o. female with a known history of Dementia, COPD presents to the emergency room from local nursing home memory unit due to being more confused and lethargic.  1. Dehydration due to poor oral intake causing hypotension No pneumonia on chest x-ray. Urinalysis showed no signs of UTI. No other source of infection found. She is at baseline and would benefit from  palliative care for goals of care consult at [facility]..  2 Acute kidney injury due to decreased oral intake and dehydration Creatinine improved with IVF.  3  COPD is stable.  4 Dementia: Patient is pleasantly confused and at baseline  DISCHARGE CONDITIONS AND DIET:  Regular Stable condition  CONSULTS OBTAINED:    DRUG ALLERGIES:  No Known Allergies  DISCHARGE MEDICATIONS:   Current Discharge Medication List    CONTINUE these medications which have NOT CHANGED   Details  aspirin EC 81 MG tablet Take 81 mg by mouth daily.    cholecalciferol (VITAMIN D) 1000 units tablet Take 1,000 Units by mouth daily.    folic acid (FOLVITE) 1 MG tablet Take 1 mg by mouth daily.    fosfomycin (MONUROL) 3 g PACK Take 3 g by mouth once. Please mix in 8 oz of water, take by mouth once Qty: 3 g, Refills: 0    pravastatin (PRAVACHOL) 10 MG tablet Take 10 mg by mouth at bedtime.               Today   CHIEF COMPLAINT:   Patient with dementia Pleasantly confused   VITAL SIGNS:  Blood pressure (!) 108/46, pulse 84, temperature 98.4 F (36.9 C), resp. rate 16, height  (1.651 m), weight 58.7 kg (129 lb 8 oz), SpO2 100 %.   REVIEW OF SYSTEMS:  Review of Systems  Unable to perform ROS: Dementia     PHYSICAL EXAMINATION:  GENERAL:  70 y.o.-year-old patient lying in the bed with no acute distress.  NECK:  Supple, no jugular venous distention. No thyroid enlargement, no tenderness.  LUNGS: Normal breath sounds bilaterally, no wheezing, rales,rhonchi  No use of accessory muscles of respiration.  CARDIOVASCULAR: S1, S2 normal. No murmurs, rubs, or gallops.  ABDOMEN: Soft, non-tender, non-distended. Bowel sounds present. No organomegaly or mass.  EXTREMITIES: No pedal edema, cyanosis, or clubbing.  PSYCHIATRIC: The patient is alert and oriented x name.  SKIN: No obvious rash, lesion, or ulcer.   DATA REVIEW:   CBC  Recent Labs Lab 07/29/16 0518  WBC 6.9  HGB 11.3*  HCT 32.9*  PLT 286    Chemistries   Recent Labs Lab 07/28/16 1222 07/29/16 0518  NA 141 146*  K 4.8 3.7  CL 112* 121*  CO2 18* 17*  GLUCOSE 94 83  BUN 61* 46*  CREATININE 2.03* 1.37*  CALCIUM 10.3 9.6  AST 24  --   ALT  12*  --   ALKPHOS 144*  --   BILITOT 0.7  --     Cardiac Enzymes No results for input(s): TROPONINI in the last 168 hours.  Microbiology Results  @MICRORSLT48 @  RADIOLOGY:  Dg Chest 2 View  Result Date: 07/28/2016 CLINICAL DATA:  Dementia EXAM: CHEST  2 VIEW COMPARISON:  01/30/2013 FINDINGS: Heart size is normal. Aortic atherosclerosis. The lungs are clear. No heart failure or effusion. No acute bone finding. IMPRESSION: No active disease.  Aortic atherosclerosis. Electronically Signed   By: Paulina Fusi M.D.   On: 07/28/2016 13:14      Stable for discharge   Patient should follow up with MD at facility  CODE STATUS:     Code Status  Orders        Start     Ordered   07/28/16 1411  Full code  Continuous     07/28/16 1410    Code Status History    Date Active Date Inactive Code Status Order ID Comments User Context   This patient has a current code status but no historical code status.      TOTAL TIME TAKING CARE OF THIS PATIENT: 35 minutes.    Note: This dictation was prepared with Dragon dictation along with smaller phrase technology. Any transcriptional errors that result from this process are unintentional.  Charlina Dwight M.D on 07/29/2016 at 9:47 AM  Between 7am to 6pm - Pager - [phone] After 6pm go to [email or web address] - password [email or web address]  [facility] [place] [facility]  Office  [phone]  CC: Primary care physician; Pcp Not In System

## 2016-07-29 NOTE — Care Management (Signed)
Patient being followed by CSW for return to SNF at discharge.

## 2016-07-29 NOTE — Care Management Obs Status (Signed)
MEDICARE OBSERVATION STATUS NOTIFICATION   Patient Details  Name: Candice Randall MRN: [id number] Date of Birth: 1946-07-19   Medicare Observation Status Notification Given:  Other (see comment) (<24 hours in OBS)    Adonis Huguenin, RN 07/29/2016, 10:43 AM

## 2016-07-29 NOTE — NC FL2 (Signed)
[place] MEDICAID FL2 LEVEL OF CARE SCREENING TOOL     IDENTIFICATION  Patient Name: Candice Randall Birthdate: 1946/10/08 Sex: female Admission Date (Current Location): 07/28/2016  [facility] and [place] Number:  [address]  ([id number]) Facility and Address:  [facility], [address], [place], [place] [address]      Provider Number: [id number]  Attending Physician Name and Address:  Adrian Saran, MD  Relative Name and Phone Number:       Current Level of Care: Hospital Recommended Level of Care: Assisted Living Facility, Memory Care Prior Approval Number:    Date Approved/Denied:   PASRR Number:  ( [id number] )  Discharge Plan: Domiciliary (Rest home)    Current Diagnoses: Primary: Dementia  Patient Active Problem List   Diagnosis Date Noted  . Dehydration 07/28/2016    Orientation RESPIRATION BLADDER Height & Weight     Self  Normal Continent Weight: 129 lb 8 oz (58.7 kg) Height:   (165.1 cm)  BEHAVIORAL SYMPTOMS/MOOD NEUROLOGICAL BOWEL NUTRITION STATUS   (none )  (none ) Continent Diet (Diet: Regualr )  AMBULATORY STATUS COMMUNICATION OF NEEDS Skin   Supervision Verbally Normal                       Personal Care Assistance Level of Assistance  Bathing, Feeding, Dressing Bathing Assistance: Limited assistance Feeding assistance: Limited assistance Dressing Assistance: Limited assistance     Functional Limitations Info  Sight, Hearing, Speech Sight Info: Adequate Hearing Info: Adequate Speech Info: Adequate    SPECIAL CARE FACTORS FREQUENCY                       Contractures      Additional Factors Info  Code Status, Allergies Code Status Info:  (Full Code. ) Allergies Info:  (No Known Allergies. )           Current Medications (07/29/2016):  This is the current hospital active medication list Current Facility-Administered Medications  Medication Dose Route Frequency Provider Last Rate Last Dose  .  0.9 %  sodium chloride infusion   Intravenous Continuous Milagros Loll, MD 100 mL/hr at 07/29/16 0524    . acetaminophen (TYLENOL) tablet 650 mg  650 mg Oral Q6H PRN Milagros Loll, MD       Or  . acetaminophen (TYLENOL) suppository 650 mg  650 mg Rectal Q6H PRN Srikar Sudini, MD      . albuterol (PROVENTIL) (2.5 MG/3ML) 0.083% nebulizer solution 2.5 mg  2.5 mg Nebulization Q2H PRN Srikar Sudini, MD      . aspirin EC tablet 81 mg  81 mg Oral Daily Srikar Sudini, MD      . docusate sodium (COLACE) capsule 100 mg  100 mg Oral BID Milagros Loll, MD   100 mg at 07/28/16 2251  . enoxaparin (LOVENOX) injection 30 mg  30 mg Subcutaneous Q24H Srikar Sudini, MD      . folic acid (FOLVITE) tablet 1 mg  1 mg Oral Daily Srikar Sudini, MD      . ondansetron (ZOFRAN) tablet 4 mg  4 mg Oral Q6H PRN Srikar Sudini, MD       Or  . ondansetron (ZOFRAN) injection 4 mg  4 mg Intravenous Q6H PRN Srikar Sudini, MD      . oxyCODONE (Oxy IR/ROXICODONE) immediate release tablet 5 mg  5 mg Oral Q4H PRN Srikar Sudini, MD      . polyethylene glycol (MIRALAX / GLYCOLAX) packet  17 g  17 g Oral Daily PRN Srikar Sudini, MD      . pravastatin (PRAVACHOL) tablet 10 mg  10 mg Oral QHS Milagros Loll, MD   10 mg at 07/28/16 2251     Discharge Medications: Please see discharge summary for a list of discharge medications.  Relevant Imaging Results:  Relevant Lab Results:   Additional Information  (SSN: [id number])  Carly Applegate, Darleen Crocker, LCSW

## 2016-07-29 NOTE — Progress Notes (Signed)
Pt refused vital signs this AM, refused breakfast. Tray set up and left at bedside. Pt told nursing staff to "get out of here and leave me alone". Spoke with MD, plan to D/C later this shift.

## 2016-07-29 NOTE — Progress Notes (Signed)
New referral for Palliative Care consult at [facility] care following discharge received from CSW [facility]. Paient information faxed to [facility] referral. Thank you. Dayna Barker RN, BSN, [facility] [employer], hospital liaison [phone] c

## 2016-07-29 NOTE — Progress Notes (Signed)
Patient is medically stable for D/C back to [facility] today. Per [employer] at [facility] [place] patient can return. Trish from [facility] transported patient. Clinical Child psychotherapist (CSW) sent D/C orders to [facility]. Patient's daughter [name] and sister Bonita Quin are aware of above. RN aware of above. Please reconsult if future social work needs arise. CSW signing off.   [employer], LCSW [phone]

## 2016-07-29 NOTE — Evaluation (Signed)
Physical Therapy Evaluation Patient Details Name: Candice Randall MRN: [id number] DOB: 10/08/1946 Today's Date: 07/29/2016   History of Present Illness  70 y/o female here with dehydration, she is confused and initially did not agree to work with PT but with encouragement PT was able to get her to participate.   Clinical Impression  Pt was difficult to keep on task and motivated to get out of bed, but once she was up she was able to slowly but safely circumambulate the nurses' station with directional supervision.  She has limited awareness or ability to follow cues for gait, but ultimately did not have any overt LOBs or safety issues apart from her general confusion/lack of awareness.    Follow Up Recommendations No PT follow up    Equipment Recommendations  None recommended by PT    Recommendations for Other Services       Precautions / Restrictions Precautions Precautions: Fall Restrictions Weight Bearing Restrictions: No      Mobility  Bed Mobility               General bed mobility comments: It takes some cuing and "negotiation" to get pt sitting at EOB, but needs only CGA once she agrees  Transfers Overall transfer level: Modified independent Equipment used: None             General transfer comment: Pt initially hesitant to get to standing, but does rise w/o assist.   Ambulation/Gait Ambulation/Gait assistance: Supervision Ambulation Distance (Feet): 200 Feet Assistive device: None       General Gait Details: Pt does not has any LOBs, but does need constant directional cuing.  She occasionally reaches to hold the rail but ultimately has no LOBs with slow but consistent ambulation.   Stairs            Wheelchair Mobility    Modified Rankin (Stroke Patients Only)       Balance                                             Pertinent Vitals/Pain Pain Assessment: No/denies pain    Home Living Family/patient expects to be  discharged to::  (Memory care unit)                      Prior Function Level of Independence:  (mental status makes history gathering difficult)               Hand Dominance        Extremity/Trunk Assessment   Upper Extremity Assessment: Generalized weakness;Difficult to assess due to impaired cognition           Lower Extremity Assessment: Difficult to assess due to impaired cognition;Generalized weakness         Communication   Communication: Receptive difficulties;Expressive difficulties  Cognition Arousal/Alertness:  (baseline dementia) Behavior During Therapy: Restless Overall Cognitive Status: History of cognitive impairments - at baseline                      General Comments      Exercises        Assessment/Plan    PT Assessment Patent does not need any further PT services  PT Diagnosis Generalized weakness   PT Problem List    PT Treatment Interventions     PT Goals (Current goals can be found  in the Care Plan section) Acute Rehab PT Goals PT Goal Formulation: Patient unable to participate in goal setting    Frequency     Barriers to discharge        Co-evaluation               End of Session Equipment Utilized During Treatment: Gait belt Activity Tolerance: Patient tolerated treatment well Patient left: with bed alarm set;with call bell/phone within reach      Functional Assessment Tool Used: clinical judgement Functional Limitation: Mobility: Walking and moving around Mobility: Walking and Moving Around Current Status (Z6109): At least 1 percent but less than 20 percent impaired, limited or restricted Mobility: Walking and Moving Around Goal Status [phone]): At least 1 percent but less than 20 percent impaired, limited or restricted Mobility: Walking and Moving Around Discharge Status [phone]): At least 1 percent but less than 20 percent impaired, limited or restricted    Time: 9147-8295 PT Time Calculation  (min) (ACUTE ONLY): 10 min   Charges:   PT Evaluation $PT Eval Low Complexity: 1 Procedure     PT G Codes:   PT G-Codes **NOT FOR INPATIENT CLASS** Functional Assessment Tool Used: clinical judgement Functional Limitation: Mobility: Walking and moving around Mobility: Walking and Moving Around Current Status (A2130): At least 1 percent but less than 20 percent impaired, limited or restricted Mobility: Walking and Moving Around Goal Status [phone]): At least 1 percent but less than 20 percent impaired, limited or restricted Mobility: Walking and Moving Around Discharge Status [phone]): At least 1 percent but less than 20 percent impaired, limited or restricted    Malachi Pro, DPT 07/29/2016, 10:19 AM

## 2017-03-25 ENCOUNTER — Emergency Department: Payer: Medicare Other

## 2017-03-25 ENCOUNTER — Encounter: Payer: Self-pay | Admitting: Emergency Medicine

## 2017-03-25 ENCOUNTER — Inpatient Hospital Stay
Admission: EM | Admit: 2017-03-25 | Discharge: 2017-03-29 | DRG: 871 | Disposition: A | Discharge status: Nursing Home (Includes ICF) | Payer: Medicare Other | Attending: Internal Medicine | Admitting: Internal Medicine

## 2017-03-25 DIAGNOSIS — F0390 Unspecified dementia without behavioral disturbance: Secondary | ICD-10-CM | POA: Diagnosis present

## 2017-03-25 DIAGNOSIS — F17200 Nicotine dependence, unspecified, uncomplicated: Secondary | ICD-10-CM | POA: Diagnosis present

## 2017-03-25 DIAGNOSIS — E559 Vitamin D deficiency, unspecified: Secondary | ICD-10-CM | POA: Diagnosis present

## 2017-03-25 DIAGNOSIS — E785 Hyperlipidemia, unspecified: Secondary | ICD-10-CM | POA: Diagnosis present

## 2017-03-25 DIAGNOSIS — Z7982 Long term (current) use of aspirin: Secondary | ICD-10-CM | POA: Diagnosis not present

## 2017-03-25 DIAGNOSIS — I959 Hypotension, unspecified: Secondary | ICD-10-CM | POA: Diagnosis present

## 2017-03-25 DIAGNOSIS — R4182 Altered mental status, unspecified: Secondary | ICD-10-CM | POA: Diagnosis present

## 2017-03-25 DIAGNOSIS — J440 Chronic obstructive pulmonary disease with acute lower respiratory infection: Secondary | ICD-10-CM | POA: Diagnosis present

## 2017-03-25 DIAGNOSIS — A419 Sepsis, unspecified organism: Secondary | ICD-10-CM | POA: Diagnosis not present

## 2017-03-25 DIAGNOSIS — T68XXXA Hypothermia, initial encounter: Secondary | ICD-10-CM

## 2017-03-25 DIAGNOSIS — Z79899 Other long term (current) drug therapy: Secondary | ICD-10-CM | POA: Diagnosis not present

## 2017-03-25 DIAGNOSIS — J189 Pneumonia, unspecified organism: Secondary | ICD-10-CM | POA: Diagnosis present

## 2017-03-25 DIAGNOSIS — Y95 Nosocomial condition: Secondary | ICD-10-CM | POA: Diagnosis present

## 2017-03-25 LAB — COMPREHENSIVE METABOLIC PANEL
ALBUMIN: 2.7 g/dL — AB (ref 3.5–5.0)
ALK PHOS: 87 U/L (ref 38–126)
ALT: 18 U/L (ref 14–54)
AST: 34 U/L (ref 15–41)
Anion gap: 6 (ref 5–15)
BILIRUBIN TOTAL: 0.6 mg/dL (ref 0.3–1.2)
BUN: 13 mg/dL (ref 6–20)
CALCIUM: 9.6 mg/dL (ref 8.9–10.3)
CO2: 25 mmol/L (ref 22–32)
CREATININE: 1.13 mg/dL — AB (ref 0.44–1.00)
Chloride: 110 mmol/L (ref 101–111)
GFR calc non Af Amer: 47 mL/min — ABNORMAL LOW (ref >60)
GFR, EST AFRICAN AMERICAN: 54 mL/min — AB (ref >60)
GLUCOSE: 101 mg/dL — AB (ref 65–99)
Potassium: 3.1 mmol/L — ABNORMAL LOW (ref 3.5–5.1)
SODIUM: 141 mmol/L (ref 135–145)
Total Protein: 5.8 g/dL — ABNORMAL LOW (ref 6.5–8.1)

## 2017-03-25 LAB — URINALYSIS, ROUTINE W REFLEX MICROSCOPIC
Bilirubin Urine: NEGATIVE
Glucose, UA: NEGATIVE mg/dL
HGB URINE DIPSTICK: NEGATIVE
Ketones, ur: NEGATIVE mg/dL
Leukocytes, UA: NEGATIVE
Nitrite: NEGATIVE
PROTEIN: NEGATIVE mg/dL
Specific Gravity, Urine: 1.012 (ref 1.005–1.030)
pH: 5 (ref 5.0–8.0)

## 2017-03-25 LAB — CBC WITH DIFFERENTIAL/PLATELET
Basophils Absolute: 0 10*3/uL (ref 0–0.1)
Basophils Relative: 0 %
EOS PCT: 2 %
Eosinophils Absolute: 0.2 10*3/uL (ref 0–0.7)
HCT: 27.8 % — ABNORMAL LOW (ref 35.0–47.0)
Hemoglobin: 9.2 g/dL — ABNORMAL LOW (ref 12.0–16.0)
LYMPHS ABS: 4.2 10*3/uL — AB (ref 1.0–3.6)
LYMPHS PCT: 43 %
MCH: 28 pg (ref 26.0–34.0)
MCHC: 33.2 g/dL (ref 32.0–36.0)
MCV: 84.4 fL (ref 80.0–100.0)
Monocytes Absolute: 0.6 10*3/uL (ref 0.2–0.9)
Monocytes Relative: 7 %
Neutro Abs: 4.9 10*3/uL (ref 1.4–6.5)
Neutrophils Relative %: 48 %
PLATELETS: 297 10*3/uL (ref 150–440)
RBC: 3.29 MIL/uL — ABNORMAL LOW (ref 3.80–5.20)
RDW: 16.1 % — AB (ref 11.5–14.5)
WBC: 10 10*3/uL (ref 3.6–11.0)

## 2017-03-25 LAB — LACTIC ACID, PLASMA: Lactic Acid, Venous: 2.1 mmol/L (ref 0.5–1.9)

## 2017-03-25 LAB — PROCALCITONIN: Procalcitonin: <0.1 ng/mL

## 2017-03-25 LAB — INFLUENZA PANEL BY PCR (TYPE A & B)
INFLAPCR: NEGATIVE
Influenza B By PCR: NEGATIVE

## 2017-03-25 LAB — LIPASE, BLOOD: Lipase: 12 U/L (ref 11–51)

## 2017-03-25 LAB — PROTIME-INR
INR: 1.03
Prothrombin Time: 13.5 seconds (ref 11.4–15.2)

## 2017-03-25 LAB — TROPONIN I: Troponin I: <0.03 ng/mL (ref <0.03)

## 2017-03-25 MED ORDER — CEFTRIAXONE SODIUM-DEXTROSE 1-3.74 GM-% IV SOLR
1.0000 g | Freq: Once | INTRAVENOUS | Status: AC
  Administered 2017-03-25: 1 g via INTRAVENOUS
  Filled 2017-03-25: qty 50

## 2017-03-25 MED ORDER — DONEPEZIL HCL 5 MG PO TABS
5.0000 mg | ORAL_TABLET | Freq: Every day | ORAL | Status: DC
  Administered 2017-03-26 – 2017-03-28 (×4): 5 mg via ORAL
  Filled 2017-03-25 (×4): qty 1

## 2017-03-25 MED ORDER — ACETAMINOPHEN 325 MG PO TABS: 650.0000 mg | ORAL_TABLET | Freq: Four times a day (QID) | ORAL | Status: DC | PRN

## 2017-03-25 MED ORDER — DEXTROSE 5 % IV SOLN
1.0000 g | Freq: Two times a day (BID) | INTRAVENOUS | Status: DC
  Administered 2017-03-26 – 2017-03-28 (×5): 1 g via INTRAVENOUS
  Filled 2017-03-25 (×6): qty 1

## 2017-03-25 MED ORDER — ONDANSETRON HCL 4 MG/2ML IJ SOLN: 4.0000 mg | Freq: Four times a day (QID) | INTRAMUSCULAR | Status: DC | PRN

## 2017-03-25 MED ORDER — MIRTAZAPINE 15 MG PO TABS
7.5000 mg | ORAL_TABLET | Freq: Every day | ORAL | Status: DC
  Administered 2017-03-26 – 2017-03-28 (×4): 7.5 mg via ORAL
  Filled 2017-03-25 (×2): qty 1
  Filled 2017-03-25: qty 2
  Filled 2017-03-25: qty 1

## 2017-03-25 MED ORDER — SODIUM CHLORIDE 0.9 % IV BOLUS (SEPSIS)
1000.0000 mL | Freq: Once | INTRAVENOUS | Status: AC
  Administered 2017-03-25: 1000 mL via INTRAVENOUS

## 2017-03-25 MED ORDER — VANCOMYCIN HCL IN DEXTROSE 1-5 GM/200ML-% IV SOLN: 1000.0000 mg | Freq: Once | INTRAVENOUS | Status: DC

## 2017-03-25 MED ORDER — SODIUM CHLORIDE 0.9 % IV BOLUS (SEPSIS): 1000.0000 mL | Freq: Once | INTRAVENOUS | Status: DC

## 2017-03-25 MED ORDER — ASPIRIN EC 81 MG PO TBEC
81.0000 mg | DELAYED_RELEASE_TABLET | Freq: Every day | ORAL | Status: DC
  Administered 2017-03-26 – 2017-03-29 (×4): 81 mg via ORAL
  Filled 2017-03-25 (×4): qty 1

## 2017-03-25 MED ORDER — CEFEPIME-DEXTROSE 1 GM/50ML IV SOLR
1.0000 g | Freq: Once | INTRAVENOUS | Status: AC
  Administered 2017-03-25: 1 g via INTRAVENOUS
  Filled 2017-03-25: qty 50

## 2017-03-25 MED ORDER — ONDANSETRON HCL 4 MG PO TABS: 4.0000 mg | ORAL_TABLET | Freq: Four times a day (QID) | ORAL | Status: DC | PRN

## 2017-03-25 MED ORDER — DEXTROSE 5 % IV SOLN: 1.0000 g | Freq: Once | INTRAVENOUS | Status: DC

## 2017-03-25 MED ORDER — SODIUM CHLORIDE 0.9 % IV SOLN
INTRAVENOUS | Status: AC
  Administered 2017-03-25: via INTRAVENOUS

## 2017-03-25 MED ORDER — ACETAMINOPHEN 650 MG RE SUPP
650.0000 mg | Freq: Four times a day (QID) | RECTAL | Status: DC | PRN
Start: 2017-03-25 — End: 2017-03-29

## 2017-03-25 MED ORDER — BENZONATATE 100 MG PO CAPS: 200.0000 mg | ORAL_CAPSULE | Freq: Three times a day (TID) | ORAL | Status: DC | PRN

## 2017-03-25 MED ORDER — LORAZEPAM 0.5 MG PO TABS
0.5000 mg | ORAL_TABLET | Freq: Two times a day (BID) | ORAL | Status: DC | PRN
  Administered 2017-03-26 – 2017-03-27 (×3): 0.5 mg via ORAL
  Filled 2017-03-25 (×3): qty 1

## 2017-03-25 MED ORDER — IPRATROPIUM-ALBUTEROL 0.5-2.5 (3) MG/3ML IN SOLN: 3.0000 mL | RESPIRATORY_TRACT | Status: DC | PRN

## 2017-03-25 MED ORDER — VANCOMYCIN HCL 10 G IV SOLR
750.0000 mg | INTRAVENOUS | Status: DC
  Filled 2017-03-25: qty 750

## 2017-03-25 MED ORDER — SODIUM CHLORIDE 0.9% FLUSH
3.0000 mL | Freq: Two times a day (BID) | INTRAVENOUS | Status: DC
  Administered 2017-03-26 – 2017-03-29 (×6): 3 mL via INTRAVENOUS

## 2017-03-25 MED ORDER — PRAVASTATIN SODIUM 20 MG PO TABS
10.0000 mg | ORAL_TABLET | Freq: Every day | ORAL | Status: DC
  Administered 2017-03-26 – 2017-03-28 (×4): 10 mg via ORAL
  Filled 2017-03-25 (×2): qty 2
  Filled 2017-03-25 (×2): qty 1

## 2017-03-25 MED ORDER — ENOXAPARIN SODIUM 40 MG/0.4ML ~~LOC~~ SOLN
40.0000 mg | SUBCUTANEOUS | Status: DC
  Administered 2017-03-26 – 2017-03-28 (×4): 40 mg via SUBCUTANEOUS
  Filled 2017-03-25 (×3): qty 0.4

## 2017-03-25 MED ORDER — QUETIAPINE FUMARATE 25 MG PO TABS
50.0000 mg | ORAL_TABLET | Freq: Two times a day (BID) | ORAL | Status: DC
  Administered 2017-03-26 – 2017-03-29 (×8): 50 mg via ORAL
  Filled 2017-03-25 (×8): qty 2

## 2017-03-25 NOTE — Progress Notes (Addendum)
Pharmacy Antibiotic Note  Candice Randall is a 71 y.o. female admitted on 03/25/2017 with sepsis.  Pharmacy has been consulted for vancomycin dosing.  Plan: Will give patient vanc 1g IV x 1 in ED  Followed by vanc 750 mg q24h to start 4/1 @ 2200. Will check a trough 4/3 @ 2100 prior to 4th dose. Ke 0.0248 T1/2 19 ~ 24 hours  Will dose cefepime 1g q12h based on patient's renal function.  Height:  (160 cm) Weight: 130 lb (59 kg) IBW/kg (Calculated) : 52.4  Temp (24hrs), Avg:96.2 F (35.7 C), Min:94.9 F (34.9 C), Max:97.5 F (36.4 C)   Recent Labs Lab 03/25/17 2107 03/25/17 2108  WBC  --  10.0  CREATININE  --  1.13*  LATICACIDVEN 2.1*  --     Estimated Creatinine Clearance: 36.7 mL/min (A) (by C-G formula based on SCr of 1.13 mg/dL (H)).    No Known Allergies  Thank you for allowing pharmacy to be a part of this patient's care.  Thomasene Ripple, PharmD, BCPS Clinical Pharmacist 03/25/2017

## 2017-03-25 NOTE — ED Notes (Signed)
Pt's rectal temp 94.9, reported to EDP. Per MD try warming pt with multiple warm blankets and use bair hugger if unable to raise temp.

## 2017-03-25 NOTE — ED Triage Notes (Signed)
Received call from pt's daughter Delight Stare who lives in [place]. Daughter states she can be reached at [phone].

## 2017-03-25 NOTE — ED Triage Notes (Signed)
Pt presents to ED via [facility] from [facility] c/o AMS. EMS reports upon their arrival to SNF pt had decreased responsiveness and oral temp 96.1 per SNF staff. Initial BP 75/50 PTA. Pt given NS bolus PTA, wrapped in warm blankets. Upon arrival to ED pt is alert, confused, oral temp 97.5, BP 95/70. Pt has dementia at baseline.

## 2017-03-25 NOTE — H&P (Signed)
[facility] [place] at [facility]   PATIENT NAME: Candice Randall    MR#:  [id number]  DATE OF BIRTH:  Sep 13, 1946  DATE OF ADMISSION:  03/25/2017  PRIMARY CARE PHYSICIAN: Pcp Not In System   REQUESTING/REFERRING PHYSICIAN: Rifenbark, MD  CHIEF COMPLAINT:   Chief Complaint  Patient presents with  . Altered Mental Status    HISTORY OF PRESENT ILLNESS:  Candice Randall  is a 71 y.o. female who presents with Increase in confusion. Patient has dementia, and his further confused even from her baseline and so is unable to contribute much to the history of present illness. Information is taken collaterally from the facility she came from as well as ED physician. Patient apparently became more confused over the last couple of days, has had a mild cough, and was brought to the ED for evaluation. Here she was found to be hypotensive, hypothermic, with possible pneumonia on chest x-ray. She was treated per sepsis protocol for HCAP and hospitalists were called for admission.  PAST MEDICAL HISTORY:   Past Medical History:  Diagnosis Date  . COPD (chronic obstructive pulmonary disease) (HCC)   . Dementia   . Hyperlipidemia   . Vitamin D deficiency     PAST SURGICAL HISTORY:   Past Surgical History:  Procedure Laterality Date  . NO PAST SURGERIES      SOCIAL HISTORY:   Social History  Substance Use Topics  . Smoking status: Current Some Day Smoker  . Smokeless tobacco: Never Used  . Alcohol use No    FAMILY HISTORY:   Family History  Problem Relation Age of Onset  . Family history unknown: Yes    DRUG ALLERGIES:  No Known Allergies  MEDICATIONS AT HOME:   Prior to Admission medications   Medication Sig Start Date End Date Taking? Authorizing Provider  acetaminophen (TYLENOL) 500 MG tablet Take 500 mg by mouth 2 (two) times daily.   Yes Historical Provider, MD  aspirin EC 81 MG tablet Take 81 mg by mouth daily.   Yes Historical Provider, MD   cholecalciferol (VITAMIN D) 1000 units tablet Take 1,000 Units by mouth daily.   Yes Historical Provider, MD  colchicine 0.6 MG tablet Take 0.6 mg by mouth 2 (two) times daily.   Yes Historical Provider, MD  donepezil (ARICEPT) 5 MG tablet Take 5 mg by mouth at bedtime.   Yes Historical Provider, MD  folic acid (FOLVITE) 1 MG tablet Take 1 mg by mouth daily.   Yes Historical Provider, MD  loratadine (CLARITIN) 10 MG tablet Take 10 mg by mouth daily.   Yes Historical Provider, MD  LORazepam (ATIVAN) 0.5 MG tablet Take 0.5 mg by mouth 2 (two) times daily as needed for anxiety.   Yes Historical Provider, MD  mirtazapine (REMERON) 7.5 MG tablet Take 7.5 mg by mouth at bedtime.   Yes Historical Provider, MD  pravastatin (PRAVACHOL) 10 MG tablet Take 10 mg by mouth at bedtime.   Yes Historical Provider, MD  QUEtiapine (SEROQUEL) 50 MG tablet Take 50 mg by mouth 2 (two) times daily.   Yes Historical Provider, MD    REVIEW OF SYSTEMS:  Review of Systems  Unable to perform ROS: Acuity of condition     VITAL SIGNS:   Vitals:   03/25/17 2102 03/25/17 2103 03/25/17 2130  BP: 95/70    Pulse: 64    Resp: 18    Temp: 97.5 F (36.4 C)  (S) (!) 94.9 F (34.9 C)  TempSrc:  Oral  Rectal  SpO2: 100%    Weight:  59 kg (130 lb)   Height:   (1.6 m)    Wt Readings from Last 3 Encounters:  03/25/17 59 kg (130 lb)  07/29/16 58.7 kg (129 lb 8 oz)  06/26/16 61.2 kg (135 lb)    PHYSICAL EXAMINATION:  Physical Exam  Vitals reviewed. Constitutional: She appears well-developed and well-nourished. No distress.  HENT:  Head: Normocephalic and atraumatic.  Mouth/Throat: Oropharynx is clear and moist.  Eyes: Conjunctivae and EOM are normal. Pupils are equal, round, and reactive to light. No scleral icterus.  Neck: Normal range of motion. Neck supple. No JVD present. No thyromegaly present.  Cardiovascular: Normal rate, regular rhythm and intact distal pulses.  Exam reveals no gallop and no friction  rub.   No murmur heard. Respiratory: Effort normal. No respiratory distress. She has no wheezes. She has rales.  Left distal basilar region  GI: Soft. Bowel sounds are normal. She exhibits no distension. There is no tenderness.  Musculoskeletal: Normal range of motion. She exhibits no edema.  No arthritis, no gout  Lymphadenopathy:    She has no cervical adenopathy.  Neurological: She is alert. No cranial nerve deficit.  Unable to fully assess due to dementia and current condition  Skin: Skin is warm and dry. No rash noted. No erythema.  Psychiatric:  Unable to fully assess due to dementia and current condition    LABORATORY PANEL:   CBC  Recent Labs Lab 03/25/17 2108  WBC 10.0  HGB 9.2*  HCT 27.8*  PLT 297   ------------------------------------------------------------------------------------------------------------------  Chemistries   Recent Labs Lab 03/25/17 2108  NA 141  K 3.1*  CL 110  CO2 25  GLUCOSE 101*  BUN 13  CREATININE 1.13*  CALCIUM 9.6  AST 34  ALT 18  ALKPHOS 87  BILITOT 0.6   ------------------------------------------------------------------------------------------------------------------  Cardiac Enzymes  Recent Labs Lab 03/25/17 2108  TROPONINI <0.03   ------------------------------------------------------------------------------------------------------------------  RADIOLOGY:  Dg Chest Port 1 View  Result Date: 03/25/2017 CLINICAL DATA:  Acute onset of low blood pressure. Altered mental status. Initial encounter. EXAM: PORTABLE CHEST 1 VIEW COMPARISON:  Chest radiograph performed 07/28/2016 FINDINGS: The lungs are well-aerated. Mild left basilar opacity may reflect atelectasis or possibly mild pneumonia. There is no evidence of pleural effusion or pneumothorax. The cardiomediastinal silhouette is borderline normal in size. No acute osseous abnormalities are seen. IMPRESSION: Mild left basilar airspace opacity may reflect atelectasis or  possibly mild pneumonia. Electronically Signed   By: Roanna Raider M.D.   On: 03/25/2017 21:35    EKG:   Orders placed or performed during the hospital encounter of 03/25/17  . EKG 12-Lead  . EKG 12-Lead    IMPRESSION AND PLAN:  Principal Problem:   Sepsis (HCC) - spectrum antibiotics given in the ED and continued on admission. Lactic acid was just barely elevated, we will continue IV fluids and check serial lactate until within normal limits. Patient was initially hypotensive on presentation the ED, but her blood pressure corrected with IV fluid administration. Cultures were sent from the ED. Active Problems:   HCAP (healthcare-associated pneumonia) - broad spectrum antibiotics and cultures as above. Her differential did show a little bit of a lymphocytosis, rather than neutrophilic left shift, we will add a influenza PCR   Dementia - continue home dose dementia medicines   HLD (hyperlipidemia) - continue home dose statin  All the records are reviewed and case discussed with ED provider. Management plans discussed  with the patient and/or family.  DVT PROPHYLAXIS: SubQ lovenox  GI PROPHYLAXIS: None  ADMISSION STATUS: Inpatient  CODE STATUS: Full, will need to be verified with family or facility in morning, neither could be reached at this time. Code Status History    Date Active Date Inactive Code Status Order ID Comments User Context   07/28/2016  2:11 PM 07/29/2016  5:17 PM Full Code [id number]  Milagros Loll, MD ED      TOTAL TIME TAKING CARE OF THIS PATIENT: 45 minutes.    Karoline Fleer FIELDING 03/25/2017, 10:17 PM  [facility]  Office  [phone]  CC: Primary care physician; Pcp Not In System

## 2017-03-25 NOTE — ED Provider Notes (Signed)
[facility] Emergency Department Provider Note  ____________________________________________   First MD Initiated Contact with Patient 03/25/17 2115     (approximate)  I have reviewed the triage vital signs and the nursing notes.   HISTORY  Chief Complaint Altered Mental Status    HPI Candice Randall is a 71 y.o. female who comes to the emergency department via EMS for reported altered mental status from a nursing home. History is limited by the patient's dementia. According to EMS her first blood pressure in the field was 70/50. Further history is limited by her dementia.   Past Medical History:  Diagnosis Date  . COPD (chronic obstructive pulmonary disease) (HCC)   . Dementia   . Hyperlipidemia   . Vitamin D deficiency     Patient Active Problem List   Diagnosis Date Noted  . Sepsis (HCC) 03/25/2017  . HCAP (healthcare-associated pneumonia) 03/25/2017  . Dementia 03/25/2017  . HLD (hyperlipidemia) 03/25/2017  . Dehydration 07/28/2016    Past Surgical History:  Procedure Laterality Date  . NO PAST SURGERIES      Prior to Admission medications   Medication Sig Start Date End Date Taking? Authorizing Provider  acetaminophen (TYLENOL) 500 MG tablet Take 500 mg by mouth 2 (two) times daily.   Yes Historical Provider, MD  aspirin EC 81 MG tablet Take 81 mg by mouth daily.   Yes Historical Provider, MD  cholecalciferol (VITAMIN D) 1000 units tablet Take 1,000 Units by mouth daily.   Yes Historical Provider, MD  colchicine 0.6 MG tablet Take 0.6 mg by mouth 2 (two) times daily.   Yes Historical Provider, MD  donepezil (ARICEPT) 5 MG tablet Take 5 mg by mouth at bedtime.   Yes Historical Provider, MD  folic acid (FOLVITE) 1 MG tablet Take 1 mg by mouth daily.   Yes Historical Provider, MD  loratadine (CLARITIN) 10 MG tablet Take 10 mg by mouth daily.   Yes Historical Provider, MD  LORazepam (ATIVAN) 0.5 MG tablet Take 0.5 mg by mouth 2 (two) times  daily as needed for anxiety.   Yes Historical Provider, MD  mirtazapine (REMERON) 7.5 MG tablet Take 7.5 mg by mouth at bedtime.   Yes Historical Provider, MD  pravastatin (PRAVACHOL) 10 MG tablet Take 10 mg by mouth at bedtime.   Yes Historical Provider, MD  QUEtiapine (SEROQUEL) 50 MG tablet Take 50 mg by mouth 2 (two) times daily.   Yes Historical Provider, MD    Allergies Patient has no known allergies.  Family History  Problem Relation Age of Onset  . Family history unknown: Yes    Social History Social History  Substance Use Topics  . Smoking status: Current Some Day Smoker  . Smokeless tobacco: Never Used  . Alcohol use No    Review of Systems Level V exemption history Limited by the patient's clinical condition 10-point ROS otherwise negative.  ____________________________________________   PHYSICAL EXAM:  VITAL SIGNS: ED Triage Vitals  Enc Vitals Group     BP 03/25/17 2102 95/70     Pulse Rate 03/25/17 2102 64     Resp 03/25/17 2102 18     Temp 03/25/17 2102 97.5 F (36.4 C)     Temp Source 03/25/17 2102 Oral     SpO2 03/25/17 2102 100 %     Weight 03/25/17 2103 130 lb (59 kg)     Height 03/25/17 2103  (1.6 m)     Head Circumference --  Peak Flow --      Pain Score --      Pain Loc --      Pain Edu? --      Excl. in GC? --     Constitutional: Pleasant cooperative normal respiratory rate no distress Eyes: PERRL EOMI. Head: Atraumatic. Nose: No congestion/rhinnorhea. Mouth/Throat: No trismus Neck: No stridor.   Cardiovascular: Normal rate, regular rhythm. Grossly normal heart sounds.  Good peripheral circulation. Respiratory: Normal respiratory effort.  No retractions. Lungs CTAB and moving good air Gastrointestinal: Soft nondistended nontender no rebound no guarding no peritonitis no McBurney's tenderness negative Rovsing's no costovertebral tenderness negative Murphy's Musculoskeletal: No lower extremity edema   Neurologic:  Normal speech  and language. No gross focal neurologic deficits are appreciated. Skin:  Skin is warm, dry and intact. No rash noted.     ____________________________________________   DIFFERENTIAL  Hypothermia, sepsis, intracerebral hemorrhage, metabolic derangement ____________________________________________   LABS (all labs ordered are listed, but only abnormal results are displayed)  Labs Reviewed  LACTIC ACID, PLASMA - Abnormal; Notable for the following:       Result Value   Lactic Acid, Venous 2.1 (*)    All other components within normal limits  COMPREHENSIVE METABOLIC PANEL - Abnormal; Notable for the following:    Potassium 3.1 (*)    Glucose, Bld 101 (*)    Creatinine, Ser 1.13 (*)    Total Protein 5.8 (*)    Albumin 2.7 (*)    GFR calc non Af Amer 47 (*)    GFR calc Af Amer 54 (*)    All other components within normal limits  CBC WITH DIFFERENTIAL/PLATELET - Abnormal; Notable for the following:    RBC 3.29 (*)    Hemoglobin 9.2 (*)    HCT 27.8 (*)    RDW 16.1 (*)    Lymphs Abs 4.2 (*)    All other components within normal limits  URINALYSIS, ROUTINE W REFLEX MICROSCOPIC - Abnormal; Notable for the following:    Color, Urine YELLOW (*)    APPearance CLEAR (*)    All other components within normal limits  BASIC METABOLIC PANEL - Abnormal; Notable for the following:    Chloride 112 (*)    CO2 21 (*)    Glucose, Bld 101 (*)    All other components within normal limits  CBC - Abnormal; Notable for the following:    RBC 3.35 (*)    Hemoglobin 9.5 (*)    HCT 28.2 (*)    RDW 16.0 (*)    All other components within normal limits  MRSA PCR SCREENING  LACTIC ACID, PLASMA  LIPASE, BLOOD  TROPONIN I  PROCALCITONIN  PROTIME-INR  INFLUENZA PANEL BY PCR (TYPE A & B)    Normal urinalysis with no evidence of infection elevated lactate __________________________________________  EKG  ED ECG REPORT I, Merrily Brittle, the attending physician, personally viewed and  interpreted this ECG.  Date: 03/26/2017 Rate: 62 Rhythm: normal sinus rhythm QRS Axis: normal Intervals: normal ST/T Wave abnormalities: normal Conduction Disturbances: none Narrative Interpretation: unremarkable  ____________________________________________  RADIOLOGY  X-ray with possible left lower lobe pneumonia ____________________________________________   PROCEDURES  Procedure(s) performed: no  Procedures  Critical Care performed: no  ____________________________________________   INITIAL IMPRESSION / ASSESSMENT AND PLAN / ED COURSE  Pertinent labs & imaging results that were available during my care of the patient were reviewed by me and considered in my medical decision making (see chart for details).  The patient arrives  hypothermic but fortunately normotensive. Differential is broad and so broad labs pending.  Chest x-ray is concerning for pneumonia and along with elevated lactate all cover her for hospital associated pneumonia and admit her to the hospital. She is not septic.      ____________________________________________   FINAL CLINICAL IMPRESSION(S) / ED DIAGNOSES  Final diagnoses:  Hypothermia, initial encounter  Hypotension, unspecified hypotension type      NEW MEDICATIONS STARTED DURING THIS VISIT:  Current Discharge Medication List       Note:  This document was prepared using Dragon voice recognition software and may include unintentional dictation errors.     Merrily Brittle, MD 03/26/17 1600

## 2017-03-25 NOTE — ED Notes (Signed)
Pt transport to 203 

## 2017-03-26 LAB — BASIC METABOLIC PANEL
Anion gap: 5 (ref 5–15)
BUN: 13 mg/dL (ref 6–20)
CHLORIDE: 112 mmol/L — AB (ref 101–111)
CO2: 21 mmol/L — ABNORMAL LOW (ref 22–32)
CREATININE: 0.89 mg/dL (ref 0.44–1.00)
Calcium: 9.6 mg/dL (ref 8.9–10.3)
GFR calc Af Amer: >60 mL/min (ref >60)
GFR calc non Af Amer: >60 mL/min (ref >60)
GLUCOSE: 101 mg/dL — AB (ref 65–99)
POTASSIUM: 3.5 mmol/L (ref 3.5–5.1)
SODIUM: 138 mmol/L (ref 135–145)

## 2017-03-26 LAB — CBC
HEMATOCRIT: 28.2 % — AB (ref 35.0–47.0)
Hemoglobin: 9.5 g/dL — ABNORMAL LOW (ref 12.0–16.0)
MCH: 28.2 pg (ref 26.0–34.0)
MCHC: 33.6 g/dL (ref 32.0–36.0)
MCV: 84.1 fL (ref 80.0–100.0)
PLATELETS: 298 10*3/uL (ref 150–440)
RBC: 3.35 MIL/uL — ABNORMAL LOW (ref 3.80–5.20)
RDW: 16 % — AB (ref 11.5–14.5)
WBC: 10.3 10*3/uL (ref 3.6–11.0)

## 2017-03-26 LAB — MRSA PCR SCREENING: MRSA by PCR: NEGATIVE

## 2017-03-26 LAB — LACTIC ACID, PLASMA: Lactic Acid, Venous: 0.8 mmol/L (ref 0.5–1.9)

## 2017-03-26 MED ORDER — VANCOMYCIN HCL IN DEXTROSE 1-5 GM/200ML-% IV SOLN
1000.0000 mg | INTRAVENOUS | Status: DC
  Administered 2017-03-26: 1000 mg via INTRAVENOUS
  Filled 2017-03-26 (×2): qty 200

## 2017-03-26 MED ORDER — VANCOMYCIN HCL IN DEXTROSE 1-5 GM/200ML-% IV SOLN
1000.0000 mg | Freq: Once | INTRAVENOUS | Status: AC
  Administered 2017-03-26: 1000 mg via INTRAVENOUS
  Filled 2017-03-26: qty 200

## 2017-03-26 NOTE — Clinical Social Work Note (Signed)
CSW via chart review is following due to the patient admitting from [facility]. CSW will contact the patient's daughter for assessment when able.  Argentina Ponder, MSW, Theresia Majors [phone]

## 2017-03-26 NOTE — Progress Notes (Signed)
Spoke to pharmacist in regard to pt not receiving vancy dose last night due to no IV access. Pharmacist to place new order for vancy to start this afternoon, as well as adjusting vancy trough.

## 2017-03-26 NOTE — NC FL2 (Addendum)
[place] MEDICAID FL2 LEVEL OF CARE SCREENING TOOL     IDENTIFICATION  Patient Name: Candice Randall Birthdate: 02-04-1946 Sex: female Admission Date (Current Location): 03/25/2017  [place] and [place] Number:  Chiropodist and Address:  [facility], [address], [place], [place] [address]      Provider Number: [id number]  Attending Physician Name and Address:  Ramonita Lab, MD  Relative Name and Phone Number:       Current Level of Care: Hospital Recommended Level of Care: Assisted Living Facility, Memory Care Prior Approval Number:    Date Approved/Denied:   PASRR Number:    Discharge Plan: Domiciliary (Rest home), Memory Care    Current Diagnoses: Patient Active Problem List   Diagnosis Date Noted  . Sepsis (HCC) 03/25/2017  . HCAP (healthcare-associated pneumonia) 03/25/2017  . Dementia 03/25/2017  . HLD (hyperlipidemia) 03/25/2017  . Dehydration 07/28/2016    Orientation RESPIRATION BLADDER Height & Weight     Self  Normal Continent Weight: 123 lb 12.8 oz (56.2 kg) Height:   (160 cm)  BEHAVIORAL SYMPTOMS/MOOD NEUROLOGICAL BOWEL NUTRITION STATUS      Continent    AMBULATORY STATUS COMMUNICATION OF NEEDS Skin   Independent Verbally Normal                       Personal Care Assistance Level of Assistance  Bathing, Feeding, Dressing Bathing Assistance: Maximum assistance Feeding assistance: Limited assistance Dressing Assistance: Maximum assistance     Functional Limitations Info             SPECIAL CARE FACTORS FREQUENCY                       Contractures      Additional Factors Info                  Discharge Medications: Please see discharge summary for a list of discharge medications.  Current Discharge Medication List       START taking these medications   Details  albuterol (PROVENTIL) (2.5 MG/3ML) 0.083% nebulizer solution Take 3 mLs (2.5 mg total) by nebulization every  6 (six) hours as needed for wheezing or shortness of breath. Qty: 75 mL, Refills: 12    amoxicillin-clavulanate (AUGMENTIN) 875-125 MG tablet Take 1 tablet by mouth every 12 (twelve) hours. Qty: 10 tablet, Refills: 0    benzonatate (TESSALON) 200 MG capsule Take 1 capsule (200 mg total) by mouth 3 (three) times daily as needed for cough. Qty: 20 capsule, Refills: 0         CONTINUE these medications which have CHANGED   Details  LORazepam (ATIVAN) 0.5 MG tablet Take 1 tablet (0.5 mg total) by mouth 2 (two) times daily as needed for anxiety. Qty: 15 tablet, Refills: 0         CONTINUE these medications which have NOT CHANGED   Details  acetaminophen (TYLENOL) 500 MG tablet Take 500 mg by mouth 2 (two) times daily.    aspirin EC 81 MG tablet Take 81 mg by mouth daily.    cholecalciferol (VITAMIN D) 1000 units tablet Take 1,000 Units by mouth daily.    colchicine 0.6 MG tablet Take 0.6 mg by mouth 2 (two) times daily.    donepezil (ARICEPT) 5 MG tablet Take 5 mg by mouth at bedtime.    folic acid (FOLVITE) 1 MG tablet Take 1 mg by mouth daily.    loratadine (CLARITIN)  10 MG tablet Take 10 mg by mouth daily.    mirtazapine (REMERON) 7.5 MG tablet Take 7.5 mg by mouth at bedtime.    pravastatin (PRAVACHOL) 10 MG tablet Take 10 mg by mouth at bedtime.    QUEtiapine (SEROQUEL) 50 MG tablet Take 50 mg by mouth 2 (two) times daily.         Relevant Imaging Results:  Relevant Lab Results:   Additional Information SS# [id number]   Judi Cong, LCSW

## 2017-03-26 NOTE — Progress Notes (Signed)
[facility] [place] at [facility]   PATIENT NAME: Candice Randall    MR#:  [id number]  DATE OF BIRTH:  10-02-1946  SUBJECTIVE:  CHIEF COMPLAINT:  Patient is confused, worsening of baseline dementia. Unable to get any history from her  REVIEW OF SYSTEMS:  Review of system unobtainable  DRUG ALLERGIES:  No Known Allergies  VITALS:  Blood pressure 109/70, pulse 79, temperature 98.2 F (36.8 C), temperature source Oral, resp. rate 16, height  (1.6 m), weight 56.2 kg (123 lb 12.8 oz), SpO2 100 %.  PHYSICAL EXAMINATION:  GENERAL:  71 y.o.-year-old patient lying in the bed with no acute distress.  EYES: Pupils equal, round, reactive to light and accommodation. No scleral icterus. Extraocular muscles intact.  HEENT: Head atraumatic, normocephalic. Oropharynx and nasopharynx clear.  NECK:  Supple, no jugular venous distention. No thyroid enlargement, no tenderness.  LUNGS: Normal breath sounds bilaterally, no wheezing, rales,rhonchi or crepitation. No use of accessory muscles of respiration.  CARDIOVASCULAR: S1, S2 normal. No murmurs, rubs, or gallops.  ABDOMEN: Soft, nontender, nondistended. Bowel sounds present. No organomegaly or mass.  EXTREMITIES: No pedal edema, cyanosis, or clubbing.  NEUROLOGIC: Alert and disoriented from baseline dementia  PSYCHIATRIC: The patient is alert and Disoriented SKIN: No obvious rash, lesion, or ulcer.    LABORATORY PANEL:   CBC  Recent Labs Lab 03/26/17 0015  WBC 10.3  HGB 9.5*  HCT 28.2*  PLT 298   ------------------------------------------------------------------------------------------------------------------  Chemistries   Recent Labs Lab 03/25/17 2108 03/26/17 0015  NA 141 138  K 3.1* 3.5  CL 110 112*  CO2 25 21*  GLUCOSE 101* 101*  BUN 13 13  CREATININE 1.13* 0.89  CALCIUM 9.6 9.6  AST 34  --   ALT 18  --   ALKPHOS 87  --   BILITOT 0.6  --     ------------------------------------------------------------------------------------------------------------------  Cardiac Enzymes  Recent Labs Lab 03/25/17 2108  TROPONINI <0.03   ------------------------------------------------------------------------------------------------------------------  RADIOLOGY:  Ct Head Wo Contrast  Result Date: 03/25/2017 CLINICAL DATA:  Altered mental status, hypotension and hypothermia. History of dementia. EXAM: CT HEAD WITHOUT CONTRAST TECHNIQUE: Contiguous axial images were obtained from the base of the skull through the vertex without intravenous contrast. COMPARISON:  CT HEAD June 26, 2016 FINDINGS: BRAIN: No intraparenchymal hemorrhage, mass effect nor midline shift. The ventricles and sulci are normal for age. Patchy supratentorial white matter hypodensities are similar. No acute large vascular territory infarcts. No abnormal extra-axial fluid collections. Basal cisterns are patent. VASCULAR: Moderate calcific atherosclerosis of the carotid siphons. SKULL: No skull fracture. Osteopenia. No significant scalp soft tissue swelling. SINUSES/ORBITS: The mastoid air-cells and included paranasal sinuses are well-aerated.The included ocular globes and orbital contents are non-suspicious. OTHER:  Patient is edentulous. IMPRESSION: No acute intracranial process. Stable examination including moderate chronic small vessel ischemic disease. Electronically Signed   By: Awilda Metro M.D.   On: 03/25/2017 22:32   Dg Chest Port 1 View  Result Date: 03/25/2017 CLINICAL DATA:  Acute onset of low blood pressure. Altered mental status. Initial encounter. EXAM: PORTABLE CHEST 1 VIEW COMPARISON:  Chest radiograph performed 07/28/2016 FINDINGS: The lungs are well-aerated. Mild left basilar opacity may reflect atelectasis or possibly mild pneumonia. There is no evidence of pleural effusion or pneumothorax. The cardiomediastinal silhouette is borderline normal in size. No  acute osseous abnormalities are seen. IMPRESSION: Mild left basilar airspace opacity may reflect atelectasis or possibly mild pneumonia. Electronically Signed   By: Beryle Beams.D.  On: 03/25/2017 21:35    EKG:   Orders placed or performed during the hospital encounter of 03/25/17  . EKG 12-Lead  . EKG 12-Lead    ASSESSMENT AND PLAN:   Sepsis (HCC) - secondary to healthcare associated pneumonia. Meets septic criteria with hypotension and elevated lactic acid at the time of admission Initial lactic acid 2.1 repeated 0.8 Continue IV antibiotics cefepime and vancomycin  continue IV fluids  HCAP (healthcare-associated pneumonia) -  broad spectrum antibiotics cefepime and vancomycin CT head is negative Influenza A and B are negative Follow-up on the culture results    Dementia - continue home dose dementia medicines    HLD (hyperlipidemia) - continue home dose statin      All the records are reviewed and case discussed with Care Management/Social Workerr. Management plans discussed with the patient, family and they are in agreement.  CODE STATUS: fc   TOTAL TIME TAKING CARE OF THIS PATIENT: 36 minutes.   POSSIBLE D/C IN 2  DAYS, DEPENDING ON CLINICAL CONDITION.  Note: This dictation was prepared with Dragon dictation along with smaller phrase technology. Any transcriptional errors that result from this process are unintentional.   Ramonita Lab M.D on 03/26/2017 at 1:32 PM  Between 7am to 6pm - Pager - [phone] After 6pm go to [email or web address] - password [email or web address]  [facility]  Office  [phone]  CC: Primary care physician; Pcp Not In System

## 2017-03-26 NOTE — Progress Notes (Signed)
Pharmacy Antibiotic Note  Candice Randall is a 71 y.o. female admitted on 03/25/2017 with sepsis.  Pharmacy has been consulted for vancomycin dosing.  Plan: Will give patient vancomycin 1g IV as patient did not receive vancomycin yesterday due to IV access.  Followed by vancomycin 1000 mg q24h. Will check a trough 4/3 @ 2330  prior to 4th dose. Ke 0.043 T1/2 16 ~ 24 hours  Will dose cefepime 1g q12h based on patient's renal function.  Height:  (160 cm) Weight: 123 lb 12.8 oz (56.2 kg) IBW/kg (Calculated) : 52.4  Temp (24hrs), Avg:96.9 F (36.1 C), Min:94.9 F (34.9 C), Max:97.6 F (36.4 C)   Recent Labs Lab 03/25/17 2107 03/25/17 2108 03/26/17 0015  WBC  --  10.0 10.3  CREATININE  --  1.13* 0.89  LATICACIDVEN 2.1*  --  0.8    Estimated Creatinine Clearance: 46.6 mL/min (by C-G formula based on SCr of 0.89 mg/dL).    No Known Allergies  Thank you for allowing pharmacy to be a part of this patient's care.  Delsa Bern, PharmD Clinical Pharmacist 03/26/2017

## 2017-03-26 NOTE — Progress Notes (Signed)
Spoke with Dr. Amado Coe as not had stated continuing IV fluids and order had run out. Per MD pt is eating and drinking at this time and BP stable, so no need to be on IV fluids. Will continue to monitor.

## 2017-03-26 NOTE — Clinical Social Work Note (Signed)
Clinical Social Work Assessment  Patient Details  Name: Candice Randall MRN: [id number] Date of Birth: Oct 30, 1946  Date of referral:  03/26/17               Reason for consult:  Facility Placement                Permission sought to share information with:  Facility Industrial/product designer granted to share information::  Yes, Verbal Permission Granted  Name::        Agency::     Relationship::     Contact Information:     Housing/Transportation Living arrangements for the past 2 months:  Assisted Living Facility Source of Information:  Adult Children Patient Interpreter Needed:  None Criminal Activity/Legal Involvement Pertinent to Current Situation/Hospitalization:  No - Comment as needed Significant Relationships:  Adult Children Lives with:  Facility Resident Do you feel safe going back to the place where you live?  Yes Need for family participation in patient care:  Yes (Comment)  Care giving concerns:  Patient admitted from [facility]   Social Worker assessment / plan:  CSW spoke with the patient's daughter via telephone as the patient has significant confusion at this time. Candice Randall confirmed that her mother is a resident of [facility], and the plan is for her to return when stable. CSW attempted to contact the facility with no success.   Employment status:  Retired Health and safety inspector:  Medicare PT Recommendations:  Not assessed at this time Information / Referral to community resources:     Patient/Family's Response to care:  Patient's daughter thanked CSW for assistance.  Patient/Family's Understanding of and Emotional Response to Diagnosis, Current Treatment, and Prognosis:  The patient's daughter understands the diagnosis and is in agreement with her mother returning to her facility.  Emotional Assessment Appearance:  Appears stated age Attitude/Demeanor/Rapport:   (Patient has dementia) Affect (typically observed):   (Patient has  dementia) Orientation:   (Patient has dementia) Alcohol / Substance use:  Never Used Psych involvement (Current and /or in the community):  No (Comment)  Discharge Needs  Concerns to be addressed:  Care Coordination, Discharge Planning Concerns Readmission within the last 30 days:  No Current discharge risk:  None Barriers to Discharge:  Continued Medical Work up   [employer], LCSW 03/26/2017, 1:42 PM

## 2017-03-26 NOTE — Plan of Care (Signed)
Problem: Education: Goal: Knowledge of [facility] Education information/materials will improve Outcome: Not Progressing Pt has dementia   

## 2017-03-26 NOTE — Progress Notes (Signed)
Patient was found in hallway by staff after pulling her IV out of both access sites: left hand and right forearm.  Patient was mumbling and undressing herself.  RN assisted patient in cleaning herself, dressing, and provided aversion to stimuli.  Network engineer nurse and RN discussed use of medication, sitter, and/or other interventions to provide right care for patient's current state.  RN paged MD to request PRN orders for sitter, awaiting directives. RN stayed at bedside with patient until oncoming staff arrived.

## 2017-03-27 NOTE — Progress Notes (Signed)
[facility] - [place] at [facility]   PATIENT NAME: Candice Randall    MR#:  [id number]  DATE OF BIRTH:  July 09, 1946  SUBJECTIVE:  CHIEF COMPLAINT:  Patient is  Still confused, worsening of baseline dementia. Unable to get any history from her  REVIEW OF SYSTEMS:  Review of system unobtainable  DRUG ALLERGIES:  No Known Allergies  VITALS:  Blood pressure 125/78, pulse 69, temperature 98.6 F (37 C), temperature source Oral, resp. rate (!) 24, height  (1.6 m), weight 56.2 kg (123 lb 12.8 oz), SpO2 100 %.  PHYSICAL EXAMINATION:  GENERAL:  71 y.o.-year-old patient lying in the bed with no acute distress.  EYES: Pupils equal, round, reactive to light and accommodation. No scleral icterus. Extraocular muscles intact.  HEENT: Head atraumatic, normocephalic. Oropharynx and nasopharynx clear.  NECK:  Supple, no jugular venous distention. No thyroid enlargement, no tenderness.  LUNGS: Normal breath sounds bilaterally, no wheezing, rales,rhonchi or crepitation. No use of accessory muscles of respiration.  CARDIOVASCULAR: S1, S2 normal. No murmurs, rubs, or gallops.  ABDOMEN: Soft, nontender, nondistended. Bowel sounds present. No organomegaly or mass.  EXTREMITIES: No pedal edema, cyanosis, or clubbing.  NEUROLOGIC: Alert and disoriented from baseline dementia  PSYCHIATRIC: The patient is alert and Disoriented SKIN: No obvious rash, lesion, or ulcer.    LABORATORY PANEL:   CBC  Recent Labs Lab 03/26/17 0015  WBC 10.3  HGB 9.5*  HCT 28.2*  PLT 298   ------------------------------------------------------------------------------------------------------------------  Chemistries   Recent Labs Lab 03/25/17 2108 03/26/17 0015  NA 141 138  K 3.1* 3.5  CL 110 112*  CO2 25 21*  GLUCOSE 101* 101*  BUN 13 13  CREATININE 1.13* 0.89  CALCIUM 9.6 9.6  AST 34  --   ALT 18  --   ALKPHOS 87  --   BILITOT 0.6  --     ------------------------------------------------------------------------------------------------------------------  Cardiac Enzymes  Recent Labs Lab 03/25/17 2108  TROPONINI <0.03   ------------------------------------------------------------------------------------------------------------------  RADIOLOGY:  Ct Head Wo Contrast  Result Date: 03/25/2017 CLINICAL DATA:  Altered mental status, hypotension and hypothermia. History of dementia. EXAM: CT HEAD WITHOUT CONTRAST TECHNIQUE: Contiguous axial images were obtained from the base of the skull through the vertex without intravenous contrast. COMPARISON:  CT HEAD June 26, 2016 FINDINGS: BRAIN: No intraparenchymal hemorrhage, mass effect nor midline shift. The ventricles and sulci are normal for age. Patchy supratentorial white matter hypodensities are similar. No acute large vascular territory infarcts. No abnormal extra-axial fluid collections. Basal cisterns are patent. VASCULAR: Moderate calcific atherosclerosis of the carotid siphons. SKULL: No skull fracture. Osteopenia. No significant scalp soft tissue swelling. SINUSES/ORBITS: The mastoid air-cells and included paranasal sinuses are well-aerated.The included ocular globes and orbital contents are non-suspicious. OTHER:  Patient is edentulous. IMPRESSION: No acute intracranial process. Stable examination including moderate chronic small vessel ischemic disease. Electronically Signed   By: Awilda Metro M.D.   On: 03/25/2017 22:32   Dg Chest Port 1 View  Result Date: 03/25/2017 CLINICAL DATA:  Acute onset of low blood pressure. Altered mental status. Initial encounter. EXAM: PORTABLE CHEST 1 VIEW COMPARISON:  Chest radiograph performed 07/28/2016 FINDINGS: The lungs are well-aerated. Mild left basilar opacity may reflect atelectasis or possibly mild pneumonia. There is no evidence of pleural effusion or pneumothorax. The cardiomediastinal silhouette is borderline normal in size. No  acute osseous abnormalities are seen. IMPRESSION: Mild left basilar airspace opacity may reflect atelectasis or possibly mild pneumonia. Electronically Signed   By: Leotis Shames  Chang M.D.   On: 03/25/2017 21:35    EKG:   Orders placed or performed during the hospital encounter of 03/25/17  . EKG 12-Lead  . EKG 12-Lead    ASSESSMENT AND PLAN:   Sepsis (HCC) - secondary to healthcare associated pneumonia. Meets septic criteria with hypotension and elevated lactic acid at the time of admission Initial lactic acid 2.1 repeated 0.8 Continue IV antibiotics cefepime and  D/ced vancomycin as MRSA PCR is neg  continue IV fluids CT head is negative  HCAP (healthcare-associated pneumonia) -  broad spectrum antibiotics cefepime  CT head is negative Influenza A and B are negative Follow-up on the culture results    Dementia - continue home dose dementia medicines. 1:1 observation for safety    HLD (hyperlipidemia) - continue home dose statin      All the records are reviewed and case discussed with Care Management/Social Workerr. Management plans discussed with the patient, family and they are in agreement.  CODE STATUS: fc   TOTAL TIME TAKING CARE OF THIS PATIENT: 36 minutes.   POSSIBLE D/C IN 1  DAYS, DEPENDING ON CLINICAL CONDITION.  Note: This dictation was prepared with Dragon dictation along with smaller phrase technology. Any transcriptional errors that result from this process are unintentional.   Ramonita Lab M.D on 03/27/2017 at 3:45 PM  Between 7am to 6pm - Pager - [phone] After 6pm go to [email or web address] - password [email or web address]  [facility]  Office  [phone]  CC: Primary care physician; Pcp Not In System

## 2017-03-27 NOTE — Plan of Care (Signed)
Problem: Education: Goal: Knowledge of [place] Education information/materials will improve Outcome: Not Progressing Patient has dementia.  Problem: Health Behavior/Discharge Planning: Goal: Ability to manage health-related needs will improve Outcome: Not Progressing Patient has dementia.   

## 2017-03-28 LAB — CBC
HCT: 28.4 % — ABNORMAL LOW (ref 35.0–47.0)
HEMOGLOBIN: 9.6 g/dL — AB (ref 12.0–16.0)
MCH: 28.2 pg (ref 26.0–34.0)
MCHC: 33.8 g/dL (ref 32.0–36.0)
MCV: 83.4 fL (ref 80.0–100.0)
Platelets: 365 10*3/uL (ref 150–440)
RBC: 3.41 MIL/uL — AB (ref 3.80–5.20)
RDW: 16 % — ABNORMAL HIGH (ref 11.5–14.5)
WBC: 6.5 10*3/uL (ref 3.6–11.0)

## 2017-03-28 MED ORDER — AMOXICILLIN-POT CLAVULANATE 875-125 MG PO TABS
1.0000 | ORAL_TABLET | Freq: Two times a day (BID) | ORAL | Status: DC
  Administered 2017-03-28 – 2017-03-29 (×3): 1 via ORAL
  Filled 2017-03-28 (×3): qty 1

## 2017-03-28 NOTE — Progress Notes (Signed)
[facility] - [place] at [facility]   PATIENT NAME: Candice Randall    MR#:  [id number]  DATE OF BIRTH:  [birth year]  SUBJECTIVE:  CHIEF COMPLAINT:  Patient is  Still confused, has baseline dementia. Unable to get any history from her  REVIEW OF SYSTEMS:  Review of system unobtainable  DRUG ALLERGIES:  No Known Allergies  VITALS:  Blood pressure (!) 101/57, pulse 71, temperature 98.1 F (36.7 C), temperature source Oral, resp. rate 20, height  (1.6 m), weight 56.2 kg (123 lb 12.8 oz), SpO2 100 %.  PHYSICAL EXAMINATION:  GENERAL:  71 y.o.-year-old patient lying in the bed with no acute distress.  EYES: Pupils equal, round, reactive to light and accommodation. No scleral icterus. Extraocular muscles intact.  HEENT: Head atraumatic, normocephalic. Oropharynx and nasopharynx clear.  NECK:  Supple, no jugular venous distention. No thyroid enlargement, no tenderness.  LUNGS: Normal breath sounds bilaterally, no wheezing, rales,rhonchi or crepitation. No use of accessory muscles of respiration.  CARDIOVASCULAR: S1, S2 normal. No murmurs, rubs, or gallops.  ABDOMEN: Soft, nontender, nondistended. Bowel sounds present. No organomegaly or mass.  EXTREMITIES: No pedal edema, cyanosis, or clubbing.  NEUROLOGIC: Alert and disoriented from baseline dementia  PSYCHIATRIC: The patient is alert and Disoriented SKIN: No obvious rash, lesion, or ulcer.    LABORATORY PANEL:   CBC  Recent Labs Lab 03/28/17 0512  WBC 6.5  HGB 9.6*  HCT 28.4*  PLT 365   ------------------------------------------------------------------------------------------------------------------  Chemistries   Recent Labs Lab 03/25/17 2108 03/26/17 0015  NA 141 138  K 3.1* 3.5  CL 110 112*  CO2 25 21*  GLUCOSE 101* 101*  BUN 13 13  CREATININE 1.13* 0.89  CALCIUM 9.6 9.6  AST 34  --   ALT 18  --   ALKPHOS 87  --   BILITOT 0.6  --     ------------------------------------------------------------------------------------------------------------------  Cardiac Enzymes  Recent Labs Lab 03/25/17 2108  TROPONINI <0.03   ------------------------------------------------------------------------------------------------------------------  RADIOLOGY:  No results found.  EKG:   Orders placed or performed during the hospital encounter of 03/25/17  . EKG 12-Lead  . EKG 12-Lead    ASSESSMENT AND PLAN:   Sepsis (HCC) - secondary to healthcare associated pneumonia. Meets septic criteria with hypotension and elevated lactic acid at the time of admission Initial lactic acid 2.1 repeated 0.8  IV antibiotics cefepime changed to po Augmentin and  D/ced vancomycin as MRSA PCR is neg  received IV fluids CT head is negative dced sitter  HCAP (healthcare-associated pneumonia) -  broad spectrum antibiotics cefepime  CT head is negative Influenza A and B are negative Follow-up on the cultures , doesn't seem to be collected     Dementia - continue home dose dementia medicines. 1:1 observation for safety    HLD (hyperlipidemia) - continue home dose statin      All the records are reviewed and case discussed with Care Management/Social Workerr. Management plans discussed with the patient, family and they are in agreement.  CODE STATUS: fc   TOTAL TIME TAKING CARE OF THIS PATIENT: 36 minutes.   POSSIBLE D/C IN 1  DAYS, DEPENDING ON CLINICAL CONDITION.  Note: This dictation was prepared with Dragon dictation along with smaller phrase technology. Any transcriptional errors that result from this process are unintentional.   Ramonita Lab M.D on 03/28/2017 at 7:46 PM  Between 7am to 6pm - Pager - [phone] After 6pm go to [email or web address] - password [email or web address] [facility]  Office  [phone]  CC: Primary care physician; Pcp Not In System

## 2017-03-29 LAB — CREATININE, SERUM
Creatinine, Ser: 0.95 mg/dL (ref 0.44–1.00)
GFR calc Af Amer: >60 mL/min (ref >60)
GFR, EST NON AFRICAN AMERICAN: 58 mL/min — AB (ref >60)

## 2017-03-29 MED ORDER — ALBUTEROL SULFATE (2.5 MG/3ML) 0.083% IN NEBU: 2.5000 mg | INHALATION_SOLUTION | Freq: Four times a day (QID) | RESPIRATORY_TRACT | 12 refills | Status: AC | PRN

## 2017-03-29 MED ORDER — BENZONATATE 200 MG PO CAPS: 200.0000 mg | ORAL_CAPSULE | Freq: Three times a day (TID) | ORAL | 0 refills | Status: AC | PRN

## 2017-03-29 MED ORDER — AMOXICILLIN-POT CLAVULANATE 875-125 MG PO TABS: 1.0000 | ORAL_TABLET | Freq: Two times a day (BID) | ORAL | 0 refills | Status: DC

## 2017-03-29 MED ORDER — LORAZEPAM 0.5 MG PO TABS: 0.5000 mg | ORAL_TABLET | Freq: Two times a day (BID) | ORAL | 0 refills | Status: AC | PRN

## 2017-03-29 NOTE — Discharge Instructions (Signed)
Follow-up with primary care physician in 2-3 days Continue PT

## 2017-03-29 NOTE — Care Management (Signed)
Patient to return to [facility] today.  Patient is mobile, and transitioned to PO antibiotics.  No needs identified at discharge.  RNCM signing off

## 2017-03-29 NOTE — Care Management Important Message (Signed)
Important Message  Patient Details  Name: Candice Randall MRN: [id number] Date of Birth: July 03, 1946   Medicare Important Message Given:  Yes    Chapman Fitch, RN 03/29/2017, 10:55 AM

## 2017-03-29 NOTE — Progress Notes (Signed)
Patient discharged to the nursing home, and [place] [facility] came to pick up patient. Patient is alert to self, no complaints of pain voiced. No acute distress noted.

## 2017-03-29 NOTE — Discharge Summary (Signed)
[facility] - [place] at [facility]   PATIENT NAME: Candice Randall    MR#:  [id number]  DATE OF BIRTH:  1946-01-28  DATE OF ADMISSION:  03/25/2017 ADMITTING PHYSICIAN: Oralia Manis, MD  DATE OF DISCHARGE:  03/29/17 PRIMARY CARE PHYSICIAN: Pcp Not In System    ADMISSION DIAGNOSIS:  Hypothermia, initial encounter [T68.XXXA] Hypotension, unspecified hypotension type [I95.9]  DISCHARGE DIAGNOSIS:  Principal Problem:   Sepsis (HCC) Active Problems:   HCAP (healthcare-associated pneumonia)   Dementia   HLD (hyperlipidemia)   SECONDARY DIAGNOSIS:   Past Medical History:  Diagnosis Date  . COPD (chronic obstructive pulmonary disease) (HCC)   . Dementia   . Hyperlipidemia   . Vitamin D deficiency     HOSPITAL COURSE:  HPI :  Candice Randall  is a 71 y.o. female who presents with Increase in confusion. Patient has dementia, and his further confused even from her baseline and so is unable to contribute much to the history of present illness. Information is taken collaterally from the facility she came from as well as ED physician. Patient apparently became more confused over the last couple of days, has had a mild cough, and was brought to the ED for evaluation. Here she was found to be hypotensive, hypothermic, with possible pneumonia on chest x-ray. She was treated per sepsis protocol for HCAP and hospitalists were called for admission.    Sepsis (HCC) - secondary to healthcare associated pneumonia. Meets septic criteria with hypotension and elevated lactic acid at the time of admission Initial lactic acid 2.1 repeated 0.8  IV antibiotics cefepime changed to po Augmentin and  D/ced vancomycin as MRSA PCR is neg  received IV fluids CT head is negative dced sitter  HCAP (healthcare-associated pneumonia) -  Clinically improved broad spectrum antibiotics cefepime changed to by mouth Augmentin CT head is negative Influenza A and B are negative  cultures not  collected   Dementia - continue home dose dementia medicines. 1:1 observation for safety  HLD (hyperlipidemia) - continue home dose statin   DISCHARGE CONDITIONS:  fair  CONSULTS OBTAINED:     PROCEDURES none   DRUG ALLERGIES:  No Known Allergies  DISCHARGE MEDICATIONS:   Current Discharge Medication List    START taking these medications   Details  albuterol (PROVENTIL) (2.5 MG/3ML) 0.083% nebulizer solution Take 3 mLs (2.5 mg total) by nebulization every 6 (six) hours as needed for wheezing or shortness of breath. Qty: 75 mL, Refills: 12    amoxicillin-clavulanate (AUGMENTIN) 875-125 MG tablet Take 1 tablet by mouth every 12 (twelve) hours. Qty: 10 tablet, Refills: 0    benzonatate (TESSALON) 200 MG capsule Take 1 capsule (200 mg total) by mouth 3 (three) times daily as needed for cough. Qty: 20 capsule, Refills: 0      CONTINUE these medications which have CHANGED   Details  LORazepam (ATIVAN) 0.5 MG tablet Take 1 tablet (0.5 mg total) by mouth 2 (two) times daily as needed for anxiety. Qty: 15 tablet, Refills: 0      CONTINUE these medications which have NOT CHANGED   Details  acetaminophen (TYLENOL) 500 MG tablet Take 500 mg by mouth 2 (two) times daily.    aspirin EC 81 MG tablet Take 81 mg by mouth daily.    cholecalciferol (VITAMIN D) 1000 units tablet Take 1,000 Units by mouth daily.    colchicine 0.6 MG tablet Take 0.6 mg by mouth 2 (two) times daily.    donepezil (ARICEPT) 5 MG tablet  Take 5 mg by mouth at bedtime.    folic acid (FOLVITE) 1 MG tablet Take 1 mg by mouth daily.    loratadine (CLARITIN) 10 MG tablet Take 10 mg by mouth daily.    mirtazapine (REMERON) 7.5 MG tablet Take 7.5 mg by mouth at bedtime.    pravastatin (PRAVACHOL) 10 MG tablet Take 10 mg by mouth at bedtime.    QUEtiapine (SEROQUEL) 50 MG tablet Take 50 mg by mouth 2 (two) times daily.         DISCHARGE INSTRUCTIONS:  Follow-up with primary care physician in  2-3 days Continue PT   DIET:  Low fat, Low cholesterol diet  DISCHARGE CONDITION:  Fair  ACTIVITY:  Activity as tolerated  OXYGEN:  Home Oxygen: No.   Oxygen Delivery: room air  DISCHARGE LOCATION:  home   If you experience worsening of your admission symptoms, develop shortness of breath, life threatening emergency, suicidal or homicidal thoughts you must seek medical attention immediately by calling 911 or calling your MD immediately  if symptoms less severe.  You Must read complete instructions/literature along with all the possible adverse reactions/side effects for all the Medicines you take and that have been prescribed to you. Take any new Medicines after you have completely understood and accpet all the possible adverse reactions/side effects.   Please note  You were cared for by a hospitalist during your hospital stay. If you have any questions about your discharge medications or the care you received while you were in the hospital after you are discharged, you can call the unit and asked to speak with the hospitalist on call if the hospitalist that took care of you is not available. Once you are discharged, your primary care physician will handle any further medical issues. Please note that NO REFILLS for any discharge medications will be authorized once you are discharged, as it is imperative that you return to your primary care physician (or establish a relationship with a primary care physician if you do not have one) for your aftercare needs so that they can reassess your need for medications and monitor your lab values.     Today  Chief Complaint  Patient presents with  . Altered Mental Status   Patient is feeling much better. Denies any complaints.  ROS:  Unobtainable from dementia  VITAL SIGNS:  Blood pressure (!) 98/57, pulse 70, temperature 97.5 F (36.4 C), temperature source Oral, resp. rate 18, height  (1.6 m), weight 56.2 kg (123 lb 12.8 oz),  SpO2 100 %.  I/O:    Intake/Output Summary (Last 24 hours) at 03/29/17 1240 Last data filed at 03/29/17 0905  Gross per 24 hour  Intake              240 ml  Output                0 ml  Net              240 ml    PHYSICAL EXAMINATION:  GENERAL:  71 y.o.-year-old patient lying in the bed with no acute distress.  EYES: Pupils equal, round, reactive to light and accommodation. No scleral icterus. Extraocular muscles intact.  HEENT: Head atraumatic, normocephalic. Oropharynx and nasopharynx clear.  NECK:  Supple, no jugular venous distention. No thyroid enlargement, no tenderness.  LUNGS: Normal breath sounds bilaterally, no wheezing, rales,rhonchi or crepitation. No use of accessory muscles of respiration.  CARDIOVASCULAR: S1, S2 normal. No murmurs, rubs, or gallops.  ABDOMEN:  Soft, non-tender, non-distended. Bowel sounds present. No organomegaly or mass.  EXTREMITIES: No pedal edema, cyanosis, or clubbing.  NEUROLOGIC: Cranial nerves II through XII are intact. Muscle strength 5/5 in all extremities. Sensation intact. Gait not checked.  PSYCHIATRIC: The patient is alert and oriented x 3.  SKIN: No obvious rash, lesion, or ulcer.   DATA REVIEW:   CBC  Recent Labs Lab 03/28/17 0512  WBC 6.5  HGB 9.6*  HCT 28.4*  PLT 365    Chemistries   Recent Labs Lab 03/25/17 2108 03/26/17 0015 03/29/17 1042  NA 141 138  --   K 3.1* 3.5  --   CL 110 112*  --   CO2 25 21*  --   GLUCOSE 101* 101*  --   BUN 13 13  --   CREATININE 1.13* 0.89 0.95  CALCIUM 9.6 9.6  --   AST 34  --   --   ALT 18  --   --   ALKPHOS 87  --   --   BILITOT 0.6  --   --     Cardiac Enzymes  Recent Labs Lab 03/25/17 2108  TROPONINI <0.03    Microbiology Results  Results for orders placed or performed during the hospital encounter of 03/25/17  MRSA PCR Screening     Status: None   Collection Time: 03/26/17  2:18 PM  Result Value Ref Range Status   MRSA by PCR NEGATIVE NEGATIVE Final     Comment:        The GeneXpert MRSA Assay (FDA approved for NASAL specimens only), is one component of a comprehensive MRSA colonization surveillance program. It is not intended to diagnose MRSA infection nor to guide or monitor treatment for MRSA infections.     RADIOLOGY:  Ct Head Wo Contrast  Result Date: 03/25/2017 CLINICAL DATA:  Altered mental status, hypotension and hypothermia. History of dementia. EXAM: CT HEAD WITHOUT CONTRAST TECHNIQUE: Contiguous axial images were obtained from the base of the skull through the vertex without intravenous contrast. COMPARISON:  CT HEAD June 26, 2016 FINDINGS: BRAIN: No intraparenchymal hemorrhage, mass effect nor midline shift. The ventricles and sulci are normal for age. Patchy supratentorial white matter hypodensities are similar. No acute large vascular territory infarcts. No abnormal extra-axial fluid collections. Basal cisterns are patent. VASCULAR: Moderate calcific atherosclerosis of the carotid siphons. SKULL: No skull fracture. Osteopenia. No significant scalp soft tissue swelling. SINUSES/ORBITS: The mastoid air-cells and included paranasal sinuses are well-aerated.The included ocular globes and orbital contents are non-suspicious. OTHER:  Patient is edentulous. IMPRESSION: No acute intracranial process. Stable examination including moderate chronic small vessel ischemic disease. Electronically Signed   By: Awilda Metro M.D.   On: 03/25/2017 22:32   Dg Chest Port 1 View  Result Date: 03/25/2017 CLINICAL DATA:  Acute onset of low blood pressure. Altered mental status. Initial encounter. EXAM: PORTABLE CHEST 1 VIEW COMPARISON:  Chest radiograph performed 07/28/2016 FINDINGS: The lungs are well-aerated. Mild left basilar opacity may reflect atelectasis or possibly mild pneumonia. There is no evidence of pleural effusion or pneumothorax. The cardiomediastinal silhouette is borderline normal in size. No acute osseous abnormalities are seen.  IMPRESSION: Mild left basilar airspace opacity may reflect atelectasis or possibly mild pneumonia. Electronically Signed   By: Roanna Raider M.D.   On: 03/25/2017 21:35    EKG:   Orders placed or performed during the hospital encounter of 03/25/17  . EKG 12-Lead  . EKG 12-Lead      Management plans discussed  with the patient, SHE in agreement.  CODE STATUS:     Code Status Orders        Start     Ordered   03/25/17 2344  Full code  Continuous     03/25/17 2343    Code Status History    Date Active Date Inactive Code Status Order ID Comments User Context   07/28/2016  2:11 PM 07/29/2016  5:17 PM Full Code [id number]  Milagros Loll, MD ED      TOTAL TIME TAKING CARE OF THIS PATIENT:45 minutes.   Note: This dictation was prepared with Dragon dictation along with smaller phrase technology. Any transcriptional errors that result from this process are unintentional.   @  on 03/29/2017 at 12:40 PM  Between 7am to 6pm - Pager - [phone]  After 6pm go to [email or web address] - password [email or web address]  [facility]  Office  [phone]  CC: Primary care physician; Pcp Not In System

## 2017-04-10 ENCOUNTER — Emergency Department: Payer: Medicare Other

## 2017-04-10 ENCOUNTER — Encounter: Payer: Self-pay | Admitting: Emergency Medicine

## 2017-04-10 ENCOUNTER — Emergency Department
Admission: EM | Admit: 2017-04-10 | Discharge: 2017-04-10 | Disposition: A | Discharge status: Home / Self Care | Payer: Medicare Other | Attending: Emergency Medicine | Admitting: Emergency Medicine

## 2017-04-10 DIAGNOSIS — J449 Chronic obstructive pulmonary disease, unspecified: Secondary | ICD-10-CM | POA: Diagnosis not present

## 2017-04-10 DIAGNOSIS — E860 Dehydration: Secondary | ICD-10-CM | POA: Insufficient documentation

## 2017-04-10 DIAGNOSIS — Z79899 Other long term (current) drug therapy: Secondary | ICD-10-CM | POA: Insufficient documentation

## 2017-04-10 DIAGNOSIS — F17200 Nicotine dependence, unspecified, uncomplicated: Secondary | ICD-10-CM | POA: Insufficient documentation

## 2017-04-10 DIAGNOSIS — N390 Urinary tract infection, site not specified: Secondary | ICD-10-CM | POA: Diagnosis not present

## 2017-04-10 DIAGNOSIS — Z7982 Long term (current) use of aspirin: Secondary | ICD-10-CM | POA: Diagnosis not present

## 2017-04-10 DIAGNOSIS — R4182 Altered mental status, unspecified: Secondary | ICD-10-CM | POA: Diagnosis present

## 2017-04-10 DIAGNOSIS — R410 Disorientation, unspecified: Secondary | ICD-10-CM

## 2017-04-10 LAB — COMPREHENSIVE METABOLIC PANEL
ALT: 12 U/L — ABNORMAL LOW (ref 14–54)
AST: 32 U/L (ref 15–41)
Albumin: 3.1 g/dL — ABNORMAL LOW (ref 3.5–5.0)
Alkaline Phosphatase: 104 U/L (ref 38–126)
Anion gap: 6 (ref 5–15)
BILIRUBIN TOTAL: 0.7 mg/dL (ref 0.3–1.2)
BUN: 12 mg/dL (ref 6–20)
CHLORIDE: 110 mmol/L (ref 101–111)
CO2: 24 mmol/L (ref 22–32)
CREATININE: 1.02 mg/dL — AB (ref 0.44–1.00)
Calcium: 10.9 mg/dL — ABNORMAL HIGH (ref 8.9–10.3)
GFR, EST NON AFRICAN AMERICAN: 53 mL/min — AB (ref >60)
Glucose, Bld: 124 mg/dL — ABNORMAL HIGH (ref 65–99)
POTASSIUM: 3.9 mmol/L (ref 3.5–5.1)
Sodium: 140 mmol/L (ref 135–145)
TOTAL PROTEIN: 6.9 g/dL (ref 6.5–8.1)

## 2017-04-10 LAB — URINALYSIS, COMPLETE (UACMP) WITH MICROSCOPIC
Bilirubin Urine: NEGATIVE
GLUCOSE, UA: NEGATIVE mg/dL
HGB URINE DIPSTICK: NEGATIVE
Ketones, ur: NEGATIVE mg/dL
NITRITE: NEGATIVE
PH: 6 (ref 5.0–8.0)
PROTEIN: NEGATIVE mg/dL
Specific Gravity, Urine: 1.01 (ref 1.005–1.030)

## 2017-04-10 LAB — CBC
HCT: 29 % — ABNORMAL LOW (ref 35.0–47.0)
Hemoglobin: 9.9 g/dL — ABNORMAL LOW (ref 12.0–16.0)
MCH: 29.3 pg (ref 26.0–34.0)
MCHC: 34 g/dL (ref 32.0–36.0)
MCV: 86.1 fL (ref 80.0–100.0)
PLATELETS: 294 10*3/uL (ref 150–440)
RBC: 3.37 MIL/uL — AB (ref 3.80–5.20)
RDW: 16.2 % — AB (ref 11.5–14.5)
WBC: 7.3 10*3/uL (ref 3.6–11.0)

## 2017-04-10 LAB — TROPONIN I

## 2017-04-10 LAB — GLUCOSE, CAPILLARY: Glucose-Capillary: 104 mg/dL — ABNORMAL HIGH (ref 65–99)

## 2017-04-10 MED ORDER — SODIUM CHLORIDE 0.9 % IV BOLUS (SEPSIS)
1000.0000 mL | Freq: Once | INTRAVENOUS | Status: AC
  Administered 2017-04-10: 1000 mL via INTRAVENOUS

## 2017-04-10 MED ORDER — CEPHALEXIN 500 MG PO CAPS
500.0000 mg | ORAL_CAPSULE | Freq: Once | ORAL | Status: AC
  Administered 2017-04-10: 500 mg via ORAL
  Filled 2017-04-10: qty 1

## 2017-04-10 MED ORDER — CEPHALEXIN 500 MG PO CAPS: 500.0000 mg | ORAL_CAPSULE | Freq: Three times a day (TID) | ORAL | 0 refills | Status: AC

## 2017-04-10 NOTE — ED Notes (Signed)
Pt visitor came out stating that pt was attempting to pull out IV. Went into room, pt lying on side, not pulling at IV. No swelling or hardness or warmth noted around IV site. Fluids almost finished. Pt resting, given pillow. Lights dimmed.

## 2017-04-10 NOTE — ED Notes (Signed)
Patient denies pain and is resting comfortably.  

## 2017-04-10 NOTE — ED Notes (Signed)
Pt ripped out IV. Gauze placed over site. Bleeding controlled.

## 2017-04-10 NOTE — ED Notes (Signed)
Pt taken to xray 

## 2017-04-10 NOTE — ED Notes (Signed)
Called pt emergency contact [name], she stated that she will call for pt ride, either pt sister or [place] house to transport pt back home.

## 2017-04-10 NOTE — ED Notes (Signed)
Never heard back from [place]. This RN called [place] [facility] to get a ride for pt. They stated they would send someone to pick up pt.

## 2017-04-10 NOTE — ED Triage Notes (Signed)
Pt to ed via [facility] from [facility] with staff reporting pt with LOC at the breakfast table this am. Pt is not diabetic so staff did not check cbg. Ems reports bp on scene 75/42 intially then repeated at 92/58. Ems reports no cbg checked on pt and no time to est IV. NSR on  Monitor per ems. Pt with recent admission for PNE per EMS. Pt with hx of dementia. Pt alert but not oriented on arrival to ed. NAD.

## 2017-04-10 NOTE — ED Notes (Signed)
Ambulated to and from toilet with 1 assist.

## 2017-04-10 NOTE — ED Provider Notes (Signed)
[facility] Emergency Department Provider Note  ____________________________________________  Time seen: Approximately 12:09 PM  I have reviewed the triage vital signs and the nursing notes.   HISTORY  Chief Complaint Altered Mental Status  Level 5 caveat:  Portions of the history and physical were unable to be obtained due to dementia   HPI Kameron Glazebrook is a 71 y.o. female with a history of dementia, hyperlipidemia, COPD who presents for evaluation of altered mental status. Patient is unable to provide any history which is her baseline.According to EMS patient came from [place] [facility]. She was sitting at the breakfast table this morning when she had a syncopal episode. She did not fall off the chair. She did not hurt herself. When EMS arrived patient's initial blood pressure 75/42 however that improved to 92/58 in route without any intervention. Patient was recently discharged from the hospital 10 days ago and she was admitted for pneumonia. She is no longer taking antibiotics. Patient denies headache, chest pain, shortness of breath, abdominal pain, nausea.  Past Medical History:  Diagnosis Date  . COPD (chronic obstructive pulmonary disease) (HCC)   . Dementia   . Hyperlipidemia   . Vitamin D deficiency     Patient Active Problem List   Diagnosis Date Noted  . Sepsis (HCC) 03/25/2017  . HCAP (healthcare-associated pneumonia) 03/25/2017  . Dementia 03/25/2017  . HLD (hyperlipidemia) 03/25/2017  . Dehydration 07/28/2016    Past Surgical History:  Procedure Laterality Date  . NO PAST SURGERIES      Prior to Admission medications   Medication Sig Start Date End Date Taking? Authorizing Provider  acetaminophen (TYLENOL) 500 MG tablet Take 500 mg by mouth 2 (two) times daily.   Yes Historical Provider, MD  albuterol (PROVENTIL) (2.5 MG/3ML) 0.083% nebulizer solution Take 3 mLs (2.5 mg total) by nebulization every 6 (six) hours as needed for  wheezing or shortness of breath. 03/29/17  Yes Ramonita Lab, MD  aspirin EC 81 MG tablet Take 81 mg by mouth daily.   Yes Historical Provider, MD  benzonatate (TESSALON) 200 MG capsule Take 1 capsule (200 mg total) by mouth 3 (three) times daily as needed for cough. 03/29/17  Yes Ramonita Lab, MD  cholecalciferol (VITAMIN D) 1000 units tablet Take 1,000 Units by mouth daily.   Yes Historical Provider, MD  colchicine 0.6 MG tablet Take 0.6 mg by mouth 2 (two) times daily.   Yes Historical Provider, MD  donepezil (ARICEPT) 5 MG tablet Take 5 mg by mouth at bedtime.   Yes Historical Provider, MD  folic acid (FOLVITE) 1 MG tablet Take 1 mg by mouth daily.   Yes Historical Provider, MD  loratadine (CLARITIN) 10 MG tablet Take 10 mg by mouth daily.   Yes Historical Provider, MD  LORazepam (ATIVAN) 0.5 MG tablet Take 1 tablet (0.5 mg total) by mouth 2 (two) times daily as needed for anxiety. 03/29/17  Yes Ramonita Lab, MD  mirtazapine (REMERON) 7.5 MG tablet Take 7.5 mg by mouth at bedtime.   Yes Historical Provider, MD  pravastatin (PRAVACHOL) 10 MG tablet Take 10 mg by mouth at bedtime.   Yes Historical Provider, MD  QUEtiapine (SEROQUEL) 50 MG tablet Take 50 mg by mouth 2 (two) times daily.   Yes Historical Provider, MD  cephALEXin (KEFLEX) 500 MG capsule Take 1 capsule (500 mg total) by mouth 3 (three) times daily. 04/10/17 04/17/17  Nita Sickle, MD    Allergies Patient has no known allergies.  Family History  Problem Relation Age of Onset  . Family history unknown: Yes    Social History Social History  Substance Use Topics  . Smoking status: Current Some Day Smoker  . Smokeless tobacco: Never Used  . Alcohol use No    Review of Systems  Constitutional: Negative for fever. Eyes: Negative for visual changes. ENT: Negative for sore throat. Neck: No neck pain  Cardiovascular: Negative for chest pain. Respiratory: Negative for shortness of breath. Gastrointestinal: Negative for abdominal  pain, vomiting or diarrhea. Genitourinary: Negative for dysuria. Musculoskeletal: Negative for back pain. Skin: Negative for rash. Neurological: Negative for headaches, weakness or numbness. + syncope Psych: No SI or HI  ____________________________________________   PHYSICAL EXAM:  VITAL SIGNS: ED Triage Vitals  Enc Vitals Group     BP 04/10/17 1054 (!) 93/59     Pulse Rate 04/10/17 1054 72     Resp 04/10/17 1054 18     Temp 04/10/17 1054 97.5 F (36.4 C)     Temp Source 04/10/17 1054 Axillary     SpO2 04/10/17 1054 96 %     Weight 04/10/17 1049 130 lb (59 kg)     Height 04/10/17 1049  (1.6 m)     Head Circumference --      Peak Flow --      Pain Score --      Pain Loc --      Pain Edu? --      Excl. in GC? --     Constitutional: Alert and oriented to self only. Well appearing and in no apparent distress. HEENT:      Head: Normocephalic and atraumatic.         Eyes: Conjunctivae are normal. Sclera is non-icteric. EOMI. PERRL      Mouth/Throat: Mucous membranes are moist.       Neck: Supple with no signs of meningismus. Cardiovascular: Regular rate and rhythm. No murmurs, gallops, or rubs. 2+ symmetrical distal pulses are present in all extremities. No JVD. Respiratory: Normal respiratory effort. Lungs are clear to auscultation bilaterally. No wheezes, crackles, or rhonchi.  Gastrointestinal: Soft, non tender, and non distended with positive bowel sounds. No rebound or guarding. Musculoskeletal: Nontender with normal range of motion in all extremities. No edema, cyanosis, or erythema of extremities. Neurologic: Normal speech and language. Face is symmetric. Moving all extremities. No gross focal neurologic deficits are appreciated. Skin: Skin is warm, dry and intact. No rash noted. Psychiatric: Mood and affect are normal. Speech and behavior are normal.  ____________________________________________   LABS (all labs ordered are listed, but only abnormal results  are displayed)  Labs Reviewed  COMPREHENSIVE METABOLIC PANEL - Abnormal; Notable for the following:       Result Value   Glucose, Bld 124 (*)    Creatinine, Ser 1.02 (*)    Calcium 10.9 (*)    Albumin 3.1 (*)    ALT 12 (*)    GFR calc non Af Amer 53 (*)    All other components within normal limits  CBC - Abnormal; Notable for the following:    RBC 3.37 (*)    Hemoglobin 9.9 (*)    HCT 29.0 (*)    RDW 16.2 (*)    All other components within normal limits  GLUCOSE, CAPILLARY - Abnormal; Notable for the following:    Glucose-Capillary 104 (*)    All other components within normal limits  URINALYSIS, COMPLETE (UACMP) WITH MICROSCOPIC - Abnormal; Notable for the following:    Color,  Urine YELLOW (*)    APPearance HAZY (*)    Leukocytes, UA TRACE (*)    Bacteria, UA RARE (*)    Squamous Epithelial / LPF 0-5 (*)    All other components within normal limits  URINE CULTURE  TROPONIN I  CBG MONITORING, ED   ____________________________________________  EKG  ED ECG REPORT I, Nita Sickle, the attending physician, personally viewed and interpreted this ECG.  Normal sinus rhythm, rate of 70, normal intervals, normal axis, no ST elevations or depressions. Nonspecific ST-T wave abnormalities on anterior and lateral leads. Unchanged from prior from 03/25/17 ____________________________________________  RADIOLOGY  CXR: negative  ____________________________________________   PROCEDURES  Procedure(s) performed: None Procedures Critical Care performed:  None ____________________________________________   INITIAL IMPRESSION / ASSESSMENT AND PLAN / ED COURSE  71 y.o. female with a history of dementia, hyperlipidemia, COPD who presents for evaluation of syncopal episode at the breakfast chair. Patient is neurologically intact. He has no complaints at this time. EKG with no evidence of ischemia or arrhythmia. Vital signs are within normal limits. We'll monitor patient on  telemetry. I will check blood work to rule out dehydration, troponin to rule out ACS, UA to rule out infection, labs to rule out electrolyte abnormalities. Lungs are clear, patient is not coughing, normal work of breathing however with recent pneumonia we'll recheck a chest x-ray to rule out recurrent infection. BP here is 93/59 which seems to be patient's baseline however will give 1L bolus.  Clinical Course as of Apr 11 1519  Mon Apr 10, 2017  1518 Patient found to be dehydrated, received IV fluids. She was found to have a UTI and was started on Keflex. She remains at her baseline. Monitored for 4.5 hours in the emergency department with no evidence of arrhythmias. She is tolerating by mouth. Vital signs are stable. Patient be discharged to her skilled nursing facility at this time.  [CV]    Clinical Course User Index [CV] Nita Sickle, MD    Pertinent labs & imaging results that were available during my care of the patient were reviewed by me and considered in my medical decision making (see chart for details).    ____________________________________________   FINAL CLINICAL IMPRESSION(S) / ED DIAGNOSES  Final diagnoses:  Confusion  Dehydration  Lower urinary tract infectious disease      NEW MEDICATIONS STARTED DURING THIS VISIT:  New Prescriptions   CEPHALEXIN (KEFLEX) 500 MG CAPSULE    Take 1 capsule (500 mg total) by mouth 3 (three) times daily.     Note:  This document was prepared using Dragon voice recognition software and may include unintentional dictation errors.    Nita Sickle, MD 04/10/17 1520

## 2017-04-11 LAB — URINE CULTURE

## 2017-05-08 ENCOUNTER — Emergency Department: Payer: Medicare Other

## 2017-05-08 ENCOUNTER — Encounter: Payer: Self-pay | Admitting: Emergency Medicine

## 2017-05-08 ENCOUNTER — Emergency Department
Admission: EM | Admit: 2017-05-08 | Discharge: 2017-05-08 | Disposition: A | Discharge status: Home / Self Care | Payer: Medicare Other | Attending: Emergency Medicine | Admitting: Emergency Medicine

## 2017-05-08 DIAGNOSIS — J449 Chronic obstructive pulmonary disease, unspecified: Secondary | ICD-10-CM | POA: Insufficient documentation

## 2017-05-08 DIAGNOSIS — R4182 Altered mental status, unspecified: Secondary | ICD-10-CM | POA: Insufficient documentation

## 2017-05-08 DIAGNOSIS — Z79899 Other long term (current) drug therapy: Secondary | ICD-10-CM | POA: Diagnosis not present

## 2017-05-08 DIAGNOSIS — Z7982 Long term (current) use of aspirin: Secondary | ICD-10-CM | POA: Diagnosis not present

## 2017-05-08 DIAGNOSIS — F17200 Nicotine dependence, unspecified, uncomplicated: Secondary | ICD-10-CM | POA: Diagnosis not present

## 2017-05-08 DIAGNOSIS — J189 Pneumonia, unspecified organism: Secondary | ICD-10-CM | POA: Diagnosis not present

## 2017-05-08 LAB — URINALYSIS, COMPLETE (UACMP) WITH MICROSCOPIC
Bacteria, UA: NONE SEEN
Bilirubin Urine: NEGATIVE
GLUCOSE, UA: NEGATIVE mg/dL
Hgb urine dipstick: NEGATIVE
KETONES UR: NEGATIVE mg/dL
Leukocytes, UA: NEGATIVE
NITRITE: NEGATIVE
PH: 5 (ref 5.0–8.0)
Protein, ur: NEGATIVE mg/dL
SPECIFIC GRAVITY, URINE: 1.013 (ref 1.005–1.030)

## 2017-05-08 LAB — COMPREHENSIVE METABOLIC PANEL
ALT: 23 U/L (ref 14–54)
ANION GAP: 8 (ref 5–15)
AST: 36 U/L (ref 15–41)
Albumin: 3.1 g/dL — ABNORMAL LOW (ref 3.5–5.0)
Alkaline Phosphatase: 104 U/L (ref 38–126)
BUN: 15 mg/dL (ref 6–20)
CALCIUM: 10.5 mg/dL — AB (ref 8.9–10.3)
CHLORIDE: 105 mmol/L (ref 101–111)
CO2: 23 mmol/L (ref 22–32)
Creatinine, Ser: 1.06 mg/dL — ABNORMAL HIGH (ref 0.44–1.00)
GFR calc non Af Amer: 51 mL/min — ABNORMAL LOW (ref >60)
GFR, EST AFRICAN AMERICAN: 59 mL/min — AB (ref >60)
Glucose, Bld: 109 mg/dL — ABNORMAL HIGH (ref 65–99)
Potassium: 4.2 mmol/L (ref 3.5–5.1)
Sodium: 136 mmol/L (ref 135–145)
Total Bilirubin: 0.9 mg/dL (ref 0.3–1.2)
Total Protein: 6.6 g/dL (ref 6.5–8.1)

## 2017-05-08 LAB — CBC WITH DIFFERENTIAL/PLATELET
BASOS PCT: 1 %
Basophils Absolute: 0.1 10*3/uL (ref 0–0.1)
EOS PCT: 3 %
Eosinophils Absolute: 0.2 10*3/uL (ref 0–0.7)
HEMATOCRIT: 29.3 % — AB (ref 35.0–47.0)
Hemoglobin: 9.9 g/dL — ABNORMAL LOW (ref 12.0–16.0)
LYMPHS ABS: 4.6 10*3/uL — AB (ref 1.0–3.6)
Lymphocytes Relative: 62 %
MCH: 29.2 pg (ref 26.0–34.0)
MCHC: 33.8 g/dL (ref 32.0–36.0)
MCV: 86.3 fL (ref 80.0–100.0)
MONOS PCT: 6 %
Monocytes Absolute: 0.4 10*3/uL (ref 0.2–0.9)
NEUTROS ABS: 2 10*3/uL (ref 1.4–6.5)
Neutrophils Relative %: 28 %
Platelets: 286 10*3/uL (ref 150–440)
RBC: 3.4 MIL/uL — AB (ref 3.80–5.20)
RDW: 15.9 % — AB (ref 11.5–14.5)
WBC: 7.3 10*3/uL (ref 3.6–11.0)

## 2017-05-08 LAB — TROPONIN I

## 2017-05-08 MED ORDER — SODIUM CHLORIDE 0.9 % IV BOLUS (SEPSIS)
500.0000 mL | Freq: Once | INTRAVENOUS | Status: AC
  Administered 2017-05-08: 500 mL via INTRAVENOUS

## 2017-05-08 MED ORDER — AMOXICILLIN-POT CLAVULANATE 875-125 MG PO TABS: 1.0000 | ORAL_TABLET | Freq: Two times a day (BID) | ORAL | 0 refills | Status: AC

## 2017-05-08 MED ORDER — AMOXICILLIN-POT CLAVULANATE 875-125 MG PO TABS
1.0000 | ORAL_TABLET | Freq: Once | ORAL | Status: AC
  Administered 2017-05-08: 1 via ORAL
  Filled 2017-05-08: qty 1

## 2017-05-08 NOTE — ED Notes (Signed)
Family has been updated on phone with this RN about pt diagnosis d/c and prescriptions.

## 2017-05-08 NOTE — ED Provider Notes (Addendum)
[facility] Emergency Department Provider Note  ____________________________________________   First MD Initiated Contact with Patient 05/08/17 1100     (approximate)  I have reviewed the triage vital signs and the nursing notes.   HISTORY  Chief Complaint Altered Mental Status   HPI Candice Randall is a 71 y.o. female with a history of dementia who is presenting to the emergency department today with an altered mentation. Per EMS, she was not responding appropriately to staff at her nursing home this morning. However, once EMS arrived the patient had no complaints. She was able to ambulate. They found her blood pressure to be in the 90s but this seems to be consistent with blood pressures charted on her nursing home paperwork. To me, the patient is denying any pain. Denies any weakness.   Past Medical History:  Diagnosis Date  . COPD (chronic obstructive pulmonary disease) (HCC)   . Dementia   . Hyperlipidemia   . Vitamin D deficiency     Patient Active Problem List   Diagnosis Date Noted  . Sepsis (HCC) 03/25/2017  . HCAP (healthcare-associated pneumonia) 03/25/2017  . Dementia 03/25/2017  . HLD (hyperlipidemia) 03/25/2017  . Dehydration 07/28/2016    Past Surgical History:  Procedure Laterality Date  . NO PAST SURGERIES      Prior to Admission medications   Medication Sig Start Date End Date Taking? Authorizing Provider  acetaminophen (TYLENOL) 500 MG tablet Take 500 mg by mouth 2 (two) times daily.   Yes [provider]  albuterol (PROVENTIL) (2.5 MG/3ML) 0.083% nebulizer solution Take 3 mLs (2.5 mg total) by nebulization every 6 (six) hours as needed for wheezing or shortness of breath. 03/29/17  Yes Gouru, Deanna ArtisAruna, MD  aspirin EC 81 MG tablet Take 81 mg by mouth daily.   Yes [provider]  benzonatate (TESSALON) 200 MG capsule Take 1 capsule (200 mg total) by mouth 3 (three) times daily as needed for cough. 03/29/17  Yes  Gouru, Deanna ArtisAruna, MD  cholecalciferol (VITAMIN D) 1000 units tablet Take 1,000 Units by mouth daily.   Yes [provider]  colchicine 0.6 MG tablet Take 0.6 mg by mouth 2 (two) times daily.   Yes [provider]  donepezil (ARICEPT) 5 MG tablet Take 5 mg by mouth at bedtime.   Yes [provider]  folic acid (FOLVITE) 1 MG tablet Take 1 mg by mouth daily.   Yes [provider]  loratadine (CLARITIN) 10 MG tablet Take 10 mg by mouth daily.   Yes [provider]  LORazepam (ATIVAN) 0.5 MG tablet Take 1 tablet (0.5 mg total) by mouth 2 (two) times daily as needed for anxiety. 03/29/17  Yes Gouru, Deanna ArtisAruna, MD  mirtazapine (REMERON) 7.5 MG tablet Take 7.5 mg by mouth at bedtime.   Yes [provider]  pravastatin (PRAVACHOL) 10 MG tablet Take 10 mg by mouth at bedtime.   Yes [provider]  QUEtiapine (SEROQUEL) 50 MG tablet Take 50 mg by mouth 2 (two) times daily.   Yes [provider]    Allergies Patient has no known allergies.  Family History  Problem Relation Age of Onset  . Family history unknown: Yes    Social History Social History  Substance Use Topics  . Smoking status: Current Some Day Smoker  . Smokeless tobacco: Never Used  . Alcohol use No    Review of Systems  Constitutional: No fever/chills Eyes: No visual changes. ENT: No sore throat. Cardiovascular: Denies chest  pain. Respiratory: Denies shortness of breath. Gastrointestinal: No abdominal pain.  No nausea, no vomiting.  No diarrhea.  No constipation. Genitourinary: Negative for dysuria. Musculoskeletal: Negative for back pain. Skin: Negative for rash. Neurological: Negative for headaches, focal weakness or numbness.   ____________________________________________   PHYSICAL EXAM:  VITAL SIGNS: ED Triage Vitals  Enc Vitals Group     BP      Pulse      Resp      Temp      Temp src      SpO2      Weight      Height      Head  Circumference      Peak Flow      Pain Score      Pain Loc      Pain Edu?      Excl. in GC?     Constitutional: Alert and oriented2. The patient is not oriented to the year or her location. Well appearing and in no acute distress. Eyes: Conjunctivae are normal. PERRL. EOMI. Head: Atraumatic. Nose: No congestion/rhinnorhea. Mouth/Throat: Mucous membranes are moist.   Neck: No stridor.   Cardiovascular: Normal rate, regular rhythm. Grossly normal heart sounds.   Respiratory: Normal respiratory effort.  No retractions. Lungs CTAB. Gastrointestinal: Soft and nontender. No distention.  Musculoskeletal: No lower extremity tenderness nor edema.   Neurologic:  Normal speech and language. No gross focal neurologic deficits are appreciated.  Skin:  Skin is warm, dry and intact. No rash noted. Psychiatric: Mood and affect are normal. Speech and behavior are normal.  ____________________________________________   LABS (all labs ordered are listed, but only abnormal results are displayed)  Labs Reviewed  CBC WITH DIFFERENTIAL/PLATELET - Abnormal; Notable for the following:       Result Value   RBC 3.40 (*)    Hemoglobin 9.9 (*)    HCT 29.3 (*)    RDW 15.9 (*)    Lymphs Abs 4.6 (*)    All other components within normal limits  COMPREHENSIVE METABOLIC PANEL - Abnormal; Notable for the following:    Glucose, Bld 109 (*)    Creatinine, Ser 1.06 (*)    Calcium 10.5 (*)    Albumin 3.1 (*)    GFR calc non Af Amer 51 (*)    GFR calc Af Amer 59 (*)    All other components within normal limits  URINALYSIS, COMPLETE (UACMP) WITH MICROSCOPIC - Abnormal; Notable for the following:    Color, Urine YELLOW (*)    APPearance CLEAR (*)    Squamous Epithelial / LPF 0-5 (*)    All other components within normal limits  TROPONIN I   ____________________________________________  EKG   ____________________________________________  RADIOLOGY  DG Chest 2 View (Final result)  Result time  05/08/17 11:45:34  Final result by Charlett Nose, MD (05/08/17 11:45:34)           Narrative:   CLINICAL DATA: Altered mental status  EXAM: CHEST 2 VIEW  COMPARISON: 04/10/2017  FINDINGS: Patchy airspace opacities noted in both lower lobes, increased since prior study. This could represent worsening atelectasis or infiltrates. Mild cardiomegaly. No effusions. No acute bony abnormality.  IMPRESSION: Increasing bibasilar opacities could reflect worsening atelectasis or infiltrates.  Cardiomegaly.   Electronically Signed By: Charlett Nose M.D. On: 05/08/2017 11:45            CT Head Wo Contrast (Final result)  Result time 05/08/17 11:33:28  Final result by Elie Goody, MD (05/08/17 11:33:28)  Narrative:   CLINICAL DATA: Altered mental status  EXAM: CT HEAD WITHOUT CONTRAST  TECHNIQUE: Contiguous axial images were obtained from the base of the skull through the vertex without intravenous contrast.  COMPARISON: CT head 03/25/2017  FINDINGS: Brain: Moderate atrophy. Chronic white matter disease is stable.  Negative for acute infarct, acute hemorrhage, or mass lesion.  Vascular: Atherosclerotic calcification. Negative for hyperdense vessel  Skull: Negative  Sinuses/Orbits: Negative  Other: None  IMPRESSION: No acute abnormality. Atrophy and chronic microvascular ischemic changes.   Electronically Signed By: Marlan Palau M.D. On: 05/08/2017 11:33            ____________________________________________   PROCEDURES  Procedure(s) performed:   Procedures  Critical Care performed:   ____________________________________________   INITIAL IMPRESSION / ASSESSMENT AND PLAN / ED COURSE  Pertinent labs & imaging results that were available during my care of the patient were reviewed by me and considered in my medical decision making (see chart for details).  ----------------------------------------- 1:38 PM  on 05/08/2017 -----------------------------------------  Patient's last blood pressure was 124/81. Patient without complaints at this time. Possibly return of pneumonia. Was discharged successfully on Augmentin during her last pneumonia visit. I will discharge her on Augmentin this time as well for presumed early pneumonia. Patient is understanding the plan and willing to comply. Reassuring lab work.      ____________________________________________   FINAL CLINICAL IMPRESSION(S) / ED DIAGNOSES  HCAP    NEW MEDICATIONS STARTED DURING THIS VISIT:  New Prescriptions   No medications on file     Note:  This document was prepared using Dragon voice recognition software and may include unintentional dictation errors.    Myrna Blazer, MD 05/08/17 1339  Patient continues to be awake and alert. No difficulty in responsiveness.   Myrna Blazer, MD 05/08/17 1340

## 2017-05-08 NOTE — ED Triage Notes (Signed)
Pt to ED via EMS from [facility] for c/o AMS today. Pt VS stable, A&Ox2, denies pain.

## 2017-05-08 NOTE — ED Notes (Signed)
Fall alarm placed on pt at this time. °

## 2017-05-09 DIAGNOSIS — R55 Syncope and collapse: Secondary | ICD-10-CM | POA: Insufficient documentation

## 2017-05-09 DIAGNOSIS — R0602 Shortness of breath: Secondary | ICD-10-CM | POA: Insufficient documentation

## 2017-05-30 ENCOUNTER — Institutional Professional Consult (permissible substitution): Payer: Medicare Other | Admitting: Internal Medicine

## 2017-06-08 ENCOUNTER — Encounter: Payer: Self-pay | Admitting: *Deleted

## 2017-06-08 ENCOUNTER — Ambulatory Visit (INDEPENDENT_AMBULATORY_CARE_PROVIDER_SITE_OTHER): Payer: Medicare Other | Admitting: Internal Medicine

## 2017-06-08 ENCOUNTER — Other Ambulatory Visit: Payer: Self-pay | Admitting: *Deleted

## 2017-06-08 ENCOUNTER — Encounter: Payer: Self-pay | Admitting: Internal Medicine

## 2017-06-08 VITALS — BP 82/50 | HR 74 | Resp 16 | Ht 63.0 in | Wt 122.0 lb

## 2017-06-08 DIAGNOSIS — J449 Chronic obstructive pulmonary disease, unspecified: Secondary | ICD-10-CM

## 2017-06-08 MED ORDER — BUDESONIDE 0.5 MG/2ML IN SUSP: 0.5000 mg | Freq: Two times a day (BID) | RESPIRATORY_TRACT | 5 refills | Status: AC

## 2017-06-08 MED ORDER — FORMOTEROL FUMARATE 20 MCG/2ML IN NEBU: 20.0000 ug | INHALATION_SOLUTION | Freq: Two times a day (BID) | RESPIRATORY_TRACT | 5 refills | Status: DC

## 2017-06-08 NOTE — Patient Instructions (Addendum)
Start PULMICORT nebs Start Perforomist nebs Check ONO Check 6 minute walk test place referral for lung cancer screening program   Follow-up in 3 months

## 2017-06-08 NOTE — Progress Notes (Signed)
Name: Candice Randall MRN: [id number] DOB: [birth year]     CONSULTATION DATE: 06/08/2017  REFERRING MD :   CHIEF COMPLAINT:  SOB  STUDIES:  CXR 04/2017 I have Independently reviewed images of  CXR  on 06/08/2017 Interpretation: Probable lower lobe infiltrates represent atelectasis    HISTORY OF PRESENT ILLNESS:  71 year old pleasantly demented African-American female referred to [place] for assessment of chronic shortness of breath intermittent wheezing with intermittent coughing Patient has a diagnosis of COPD currently on Advair 250/50 twice daily Despite Advair therapy her symptoms have progressively became worse Patient is a active smoker and has smoked extensively over the last 50 years one pack per day Patient is a nursing home patient and has diagnoses of dementia Previous work history includes working at the [employer] Patient has been having progressive shortness of breath and dyspnea on exertion She is not in acute distress at this time Patient is accompanied by her cousin The cousin states that she may not be effectively taking her medications due to her dementia and respiratory capacity  There are no signs of acute infection at this time No signs of acute respirator distress at this time No signs of acute CHF at this time  PAST MEDICAL HISTORY :   has a past medical history of COPD (chronic obstructive pulmonary disease) (HCC); Dementia; Hyperlipidemia; and Vitamin D deficiency.  has a past surgical history that includes No past surgeries. Prior to Admission medications   Medication Sig Start Date End Date Taking? Authorizing Provider  acetaminophen (TYLENOL) 500 MG tablet Take 500 mg by mouth 2 (two) times daily.   Yes [provider]  albuterol (PROVENTIL HFA;VENTOLIN HFA) 108 (90 Base) MCG/ACT inhaler Inhale 1 puff into the lungs every 6 (six) hours as needed for wheezing or shortness of breath.   Yes [provider]  albuterol (PROVENTIL) (2.5  MG/3ML) 0.083% nebulizer solution Take 3 mLs (2.5 mg total) by nebulization every 6 (six) hours as needed for wheezing or shortness of breath. 03/29/17  Yes Gouru, Deanna Artis, MD  aspirin EC 81 MG tablet Take 81 mg by mouth daily.   Yes [provider]  benzonatate (TESSALON) 200 MG capsule Take 1 capsule (200 mg total) by mouth 3 (three) times daily as needed for cough. 03/29/17  Yes Gouru, Deanna Artis, MD  cholecalciferol (VITAMIN D) 1000 units tablet Take 1,000 Units by mouth daily.   Yes [provider]  colchicine 0.6 MG tablet Take 0.6 mg by mouth 2 (two) times daily.   Yes [provider]  donepezil (ARICEPT) 5 MG tablet Take 5 mg by mouth at bedtime.   Yes [provider]  Fluticasone-Salmeterol (ADVAIR) 250-50 MCG/DOSE AEPB Inhale 1 puff into the lungs 2 (two) times daily.   Yes [provider]  folic acid (FOLVITE) 1 MG tablet Take 1 mg by mouth daily.   Yes [provider]  loratadine (CLARITIN) 10 MG tablet Take 10 mg by mouth daily.   Yes [provider]  LORazepam (ATIVAN) 0.5 MG tablet Take 1 tablet (0.5 mg total) by mouth 2 (two) times daily as needed for anxiety. 03/29/17  Yes Gouru, Deanna Artis, MD  mirtazapine (REMERON) 7.5 MG tablet Take 7.5 mg by mouth at bedtime.   Yes [provider]  pravastatin (PRAVACHOL) 10 MG tablet Take 10 mg by mouth at bedtime.   Yes [provider]  QUEtiapine (SEROQUEL) 50 MG tablet Take 50 mg by mouth 2 (two) times daily.   Yes [provider]  No Known Allergies  FAMILY HISTORY:  Family history is unknown by patient. SOCIAL HISTORY:  reports that she has quit smoking. She has never used smokeless tobacco. She reports that she does not drink alcohol or use drugs.  REVIEW OF SYSTEMS:   Constitutional: Negative for fever, chills, weight loss, malaise/fatigue and diaphoresis.  HENT: Negative for hearing loss, ear pain, nosebleeds, congestion, sore throat, neck pain, tinnitus and  ear discharge.   Eyes: Negative for blurred vision, double vision, photophobia, pain, discharge and redness.  Respiratory: +cough, -hemoptysis,- sputum production,+ shortness of breath, +wheezing and stridor.   Cardiovascular: Negative for chest pain, palpitations, orthopnea, claudication, leg swelling and PND.  Gastrointestinal: Negative for heartburn, nausea, vomiting, abdominal pain, diarrhea, constipation, blood in stool and melena.  Genitourinary: Negative for dysuria, urgency, frequency, hematuria and flank pain.  Musculoskeletal: Negative for myalgias, back pain, joint pain and falls.  Skin: Negative for itching and rash.  Neurological: Negative for dizziness, tingling, tremors, sensory change, speech change, focal weakness, seizures, loss of consciousness, weakness and headaches.  Endo/Heme/Allergies: Negative for environmental allergies and polydipsia. Does not bruise/bleed easily.  ALL OTHER ROS ARE NEGATIVE   BP (!) 82/50 (BP Location: Left Arm, Patient Position: Sitting, Cuff Size: Normal)   Pulse 74   Resp 16   Ht 5\' 3"  (1.6 m)   Wt 122 lb (55.3 kg)   LMP  (LMP Unknown) Comment: unknown  SpO2 100%   BMI 21.61 kg/m    Physical Examination:   GENERAL:NAD, no fevers, chills, no weakness no fatigue HEAD: Normocephalic, atraumatic.  EYES: Pupils equal, round, reactive to light. Extraocular muscles intact. No scleral icterus.  MOUTH: Moist mucosal membrane.   EAR, NOSE, THROAT: Clear without exudates. No external lesions.  NECK: Supple. No thyromegaly. No nodules. No JVD.  PULMONARY:CTA B/L no wheezes, no crackles, no rhonchi CARDIOVASCULAR: S1 and S2. Regular rate and rhythm. No murmurs, rubs, or gallops. No edema.  GASTROINTESTINAL: Soft, nontender, nondistended. No masses. Positive bowel sounds.  MUSCULOSKELETAL: No swelling, clubbing, or edema. Range of motion full in all extremities.  NEUROLOGIC: Cranial nerves II through XII are intact. No gross focal neurological  deficits.  SKIN: No ulceration, lesions, rashes, or cyanosis. Skin warm and dry. Turgor intact.  PSYCHIATRIC: Mood, affect within normal limits. The patient is awake, alert and oriented x 3. Insight, judgment intact.       ASSESSMENT / PLAN: 71 year old Afro-American female with a history of dementia seen today for chronic progressive shortness of breath with intermittent wheezing and coughing with the diagnosis of COPD in the setting of active smoking and setting of deconditioned state  #1 shortness of breath and wheezing related to COPD Patient may not be unable to perform pulmonary function testing due to her underlying dementia and poor respiratory capacity   #2 shortness of breath and wheezing related to her COPD Due to her dementia will stop Advair due to inability to inhale the medication Will prescribe Pulmicort nebs and Perforomist nebs twice daily Check ONO Check 6 minute walk test  #3 extensive and active smoking Smoking cessation strongly advised Patient is not willing to quit at this time  #4 history of COPD and extensive smoking history age greater than 3255 Patient is a candidate for lung cancer screening referral program    Patient/Family are satisfied with Plan of action and management. All questions answered Follow up in 3 months after tests completed and we will reassess status at that time Patient will continue to have shortness of  breath and wheezing if she continues to smoke   Lucie Leather, M.D.  [facility]  Medical Director [facility] Medical Director [facility] Cardio-Pulmonary Department

## 2017-06-09 ENCOUNTER — Encounter: Payer: Self-pay | Admitting: *Deleted

## 2017-06-09 ENCOUNTER — Telehealth: Payer: Self-pay | Admitting: Internal Medicine

## 2017-06-09 DIAGNOSIS — J449 Chronic obstructive pulmonary disease, unspecified: Secondary | ICD-10-CM

## 2017-06-09 NOTE — Telephone Encounter (Signed)
Nurse at [facility] states pt needs rx for nebulizer before pharmacist can fill medicine for nebulizer.

## 2017-06-09 NOTE — Telephone Encounter (Signed)
Spoke with Candice DikeJennifer and she states pt needs nebulizer machine for her neb medications. She states the facility uses [facility] in [place]. Order placed for nebulizer machine. Nothing further needed.

## 2017-06-09 NOTE — Telephone Encounter (Signed)
Called and ask to speak with Candice Randall but no VM available will call back later.

## 2017-06-13 ENCOUNTER — Telehealth: Payer: Self-pay | Admitting: Internal Medicine

## 2017-06-13 NOTE — Telephone Encounter (Signed)
Manager at [facility] for memory department calling stating they need us to fax the orders so they Can have a copy on file.  Please fax to:  [phone]

## 2017-06-14 ENCOUNTER — Encounter: Payer: Self-pay | Admitting: Internal Medicine

## 2017-06-14 DIAGNOSIS — J449 Chronic obstructive pulmonary disease, unspecified: Secondary | ICD-10-CM

## 2017-06-14 NOTE — Telephone Encounter (Signed)
Copy of visit note faxed to [facility].

## 2017-06-15 ENCOUNTER — Telehealth: Payer: Self-pay | Admitting: Internal Medicine

## 2017-06-15 MED ORDER — AMBULATORY NON FORMULARY MEDICATION: 0 refills | Status: AC

## 2017-06-15 MED ORDER — AMBULATORY NON FORMULARY MEDICATION: 0 refills | Status: DC

## 2017-06-15 NOTE — Addendum Note (Signed)
Addended by: Alease FrameARTER, SONYA S on: 06/15/2017 02:12 PM   Modules accepted: Orders

## 2017-06-15 NOTE — Telephone Encounter (Signed)
Left message as stated below when rx for nebulizer is signed we will fax to medical supply company.

## 2017-06-15 NOTE — Telephone Encounter (Signed)
Pt caretaker calling states they need an order for nebulizer. Please call.

## 2017-06-15 NOTE — Telephone Encounter (Signed)
Called medical supply to find out status of nebulizer. A signed order is needed to accompany referral. Dr. Belia HemanKasa is not in office today and will sign order 6/22 then order will be faxed.

## 2017-06-21 ENCOUNTER — Ambulatory Visit: Payer: Medicare Other

## 2017-06-29 ENCOUNTER — Ambulatory Visit (INDEPENDENT_AMBULATORY_CARE_PROVIDER_SITE_OTHER): Payer: Medicare Other | Admitting: *Deleted

## 2017-06-29 DIAGNOSIS — J449 Chronic obstructive pulmonary disease, unspecified: Secondary | ICD-10-CM

## 2017-06-29 NOTE — Progress Notes (Signed)
SIX MIN WALK 06/29/2017  Medications Aspirin, Pulmicort, Colchicine, Pereformist, Claritan, Folic Acid, Furrous Sulfate, MPAP, Quetiapine, Vit D3  Supplimental Oxygen during Test? (L/min) No  Baseline BP (sitting) 110/62  Baseline Heartrate 64  Baseline SPO2 84  Heartrate 67  SPO2 98  Stopped or Paused before Six Minutes No  Tech Comments: Pt's O2 levels dropped from the walk from the lobby to the [facility] area. Pt unable to answer Borg scale due to dementia. Pt placed on 1L O2.

## 2017-08-11 ENCOUNTER — Emergency Department: Payer: Medicare Other

## 2017-08-11 ENCOUNTER — Encounter: Payer: Self-pay | Admitting: Emergency Medicine

## 2017-08-11 ENCOUNTER — Observation Stay
Admission: EM | Admit: 2017-08-11 | Discharge: 2017-08-13 | Disposition: A | Discharge status: Home / Self Care | Payer: Medicare Other | Attending: Internal Medicine | Admitting: Internal Medicine

## 2017-08-11 DIAGNOSIS — R197 Diarrhea, unspecified: Secondary | ICD-10-CM | POA: Insufficient documentation

## 2017-08-11 DIAGNOSIS — I959 Hypotension, unspecified: Secondary | ICD-10-CM | POA: Insufficient documentation

## 2017-08-11 DIAGNOSIS — F17200 Nicotine dependence, unspecified, uncomplicated: Secondary | ICD-10-CM | POA: Insufficient documentation

## 2017-08-11 DIAGNOSIS — R55 Syncope and collapse: Principal | ICD-10-CM | POA: Diagnosis present

## 2017-08-11 DIAGNOSIS — F419 Anxiety disorder, unspecified: Secondary | ICD-10-CM | POA: Diagnosis not present

## 2017-08-11 DIAGNOSIS — N179 Acute kidney failure, unspecified: Secondary | ICD-10-CM | POA: Diagnosis not present

## 2017-08-11 DIAGNOSIS — D509 Iron deficiency anemia, unspecified: Secondary | ICD-10-CM | POA: Diagnosis not present

## 2017-08-11 DIAGNOSIS — E785 Hyperlipidemia, unspecified: Secondary | ICD-10-CM | POA: Insufficient documentation

## 2017-08-11 DIAGNOSIS — E861 Hypovolemia: Secondary | ICD-10-CM | POA: Diagnosis not present

## 2017-08-11 DIAGNOSIS — I700 Atherosclerosis of aorta: Secondary | ICD-10-CM | POA: Insufficient documentation

## 2017-08-11 DIAGNOSIS — Z79899 Other long term (current) drug therapy: Secondary | ICD-10-CM | POA: Diagnosis not present

## 2017-08-11 DIAGNOSIS — M109 Gout, unspecified: Secondary | ICD-10-CM | POA: Diagnosis not present

## 2017-08-11 DIAGNOSIS — M7989 Other specified soft tissue disorders: Secondary | ICD-10-CM | POA: Diagnosis not present

## 2017-08-11 DIAGNOSIS — I517 Cardiomegaly: Secondary | ICD-10-CM | POA: Insufficient documentation

## 2017-08-11 DIAGNOSIS — Z7982 Long term (current) use of aspirin: Secondary | ICD-10-CM | POA: Diagnosis not present

## 2017-08-11 DIAGNOSIS — J449 Chronic obstructive pulmonary disease, unspecified: Secondary | ICD-10-CM | POA: Insufficient documentation

## 2017-08-11 DIAGNOSIS — F0390 Unspecified dementia without behavioral disturbance: Secondary | ICD-10-CM | POA: Insufficient documentation

## 2017-08-11 DIAGNOSIS — E559 Vitamin D deficiency, unspecified: Secondary | ICD-10-CM | POA: Diagnosis not present

## 2017-08-11 LAB — URINALYSIS, COMPLETE (UACMP) WITH MICROSCOPIC
BACTERIA UA: NONE SEEN
BILIRUBIN URINE: NEGATIVE
Glucose, UA: NEGATIVE mg/dL
HGB URINE DIPSTICK: NEGATIVE
KETONES UR: NEGATIVE mg/dL
LEUKOCYTES UA: NEGATIVE
NITRITE: NEGATIVE
PH: 6 (ref 5.0–8.0)
Protein, ur: NEGATIVE mg/dL
Specific Gravity, Urine: 1.01 (ref 1.005–1.030)

## 2017-08-11 LAB — COMPREHENSIVE METABOLIC PANEL
ALBUMIN: 3.1 g/dL — AB (ref 3.5–5.0)
ALK PHOS: 115 U/L (ref 38–126)
ALT: 22 U/L (ref 14–54)
AST: 40 U/L (ref 15–41)
Anion gap: 5 (ref 5–15)
BUN: 16 mg/dL (ref 6–20)
CALCIUM: 10 mg/dL (ref 8.9–10.3)
CO2: 23 mmol/L (ref 22–32)
CREATININE: 1.24 mg/dL — AB (ref 0.44–1.00)
Chloride: 115 mmol/L — ABNORMAL HIGH (ref 101–111)
GFR calc Af Amer: 49 mL/min — ABNORMAL LOW (ref >60)
GFR calc non Af Amer: 42 mL/min — ABNORMAL LOW (ref >60)
GLUCOSE: 148 mg/dL — AB (ref 65–99)
Potassium: 4.4 mmol/L (ref 3.5–5.1)
SODIUM: 143 mmol/L (ref 135–145)
Total Bilirubin: 0.6 mg/dL (ref 0.3–1.2)
Total Protein: 6.4 g/dL — ABNORMAL LOW (ref 6.5–8.1)

## 2017-08-11 LAB — DIFFERENTIAL
BASOS PCT: 1 %
Basophils Absolute: 0 10*3/uL (ref 0–0.1)
EOS PCT: 2 %
Eosinophils Absolute: 0.1 10*3/uL (ref 0–0.7)
LYMPHS PCT: 45 %
Lymphs Abs: 3.3 10*3/uL (ref 1.0–3.6)
Monocytes Absolute: 0.5 10*3/uL (ref 0.2–0.9)
Monocytes Relative: 7 %
NEUTROS ABS: 3.3 10*3/uL (ref 1.4–6.5)
NEUTROS PCT: 45 %

## 2017-08-11 LAB — CBC
HCT: 30.5 % — ABNORMAL LOW (ref 35.0–47.0)
Hemoglobin: 10.1 g/dL — ABNORMAL LOW (ref 12.0–16.0)
MCH: 29.1 pg (ref 26.0–34.0)
MCHC: 33.2 g/dL (ref 32.0–36.0)
MCV: 87.4 fL (ref 80.0–100.0)
PLATELETS: 290 10*3/uL (ref 150–440)
RBC: 3.49 MIL/uL — ABNORMAL LOW (ref 3.80–5.20)
RDW: 15.3 % — ABNORMAL HIGH (ref 11.5–14.5)
WBC: 7.3 10*3/uL (ref 3.6–11.0)

## 2017-08-11 LAB — GLUCOSE, CAPILLARY: GLUCOSE-CAPILLARY: 153 mg/dL — AB (ref 65–99)

## 2017-08-11 LAB — TROPONIN I: Troponin I: <0.03 ng/mL (ref <0.03)

## 2017-08-11 LAB — LACTIC ACID, PLASMA: Lactic Acid, Venous: 1.9 mmol/L (ref 0.5–1.9)

## 2017-08-11 NOTE — ED Provider Notes (Addendum)
[facility] Emergency Department Provider Note   ____________________________________________   First MD Initiated Contact with Patient 08/11/17 2226     (approximate)  I have reviewed the triage vital signs and the nursing notes.   HISTORY  Chief Complaint Hypotension Unable to read obtain a reliable history due to dementia  HPI Candice Randall is a 71 y.o. female who comes from the nursing home with a history of low blood pressure. Today she was unresponsive EMS found her blood pressure 62/37 while sitting. She had black tarry diarrheal stool on arrival here. She does take iron and the stool is Hemoccult negative twice. Patient laying down is a blood pressure of 98 systolic she's had blood pressures like that before. She is awake alert talking but not oriented. What I understand her mental status is normal for her.She says nothing hurts her. She has a soft swollen area in the left forearm which is old. EMS reports that when they found her she was sweaty as well as being unresponsive.   Past Medical History:  Diagnosis Date  . COPD (chronic obstructive pulmonary disease) (HCC)   . Dementia   . Hyperlipidemia   . Vitamin D deficiency     Patient Active Problem List   Diagnosis Date Noted  . SOB (shortness of breath) 05/09/2017  . Syncope 05/09/2017  . Sepsis (HCC) 03/25/2017  . HCAP (healthcare-associated pneumonia) 03/25/2017  . Dementia 03/25/2017  . HLD (hyperlipidemia) 03/25/2017  . Dehydration 07/28/2016    Past Surgical History:  Procedure Laterality Date  . NO PAST SURGERIES      Prior to Admission medications   Medication Sig Start Date End Date Taking? Authorizing Provider  acetaminophen (TYLENOL) 500 MG tablet Take 500 mg by mouth 2 (two) times daily.   Yes [provider]  albuterol (PROVENTIL HFA;VENTOLIN HFA) 108 (90 Base) MCG/ACT inhaler Inhale 1 puff into the lungs every 6 (six) hours as needed for wheezing or shortness  of breath.   Yes [provider]  albuterol (PROVENTIL) (2.5 MG/3ML) 0.083% nebulizer solution Take 3 mLs (2.5 mg total) by nebulization every 6 (six) hours as needed for wheezing or shortness of breath. 03/29/17  Yes Gouru, Deanna Artis, MD  AMBULATORY NON FORMULARY MEDICATION Medication Name: dispense nebulizer machine Dx:J44.9 06/15/17  Yes Erin Fulling, MD  aspirin EC 81 MG tablet Take 81 mg by mouth daily.   Yes [provider]  benzonatate (TESSALON) 200 MG capsule Take 1 capsule (200 mg total) by mouth 3 (three) times daily as needed for cough. 03/29/17  Yes Gouru, Aruna, MD  budesonide (PULMICORT) 0.5 MG/2ML nebulizer solution Take 2 mLs (0.5 mg total) by nebulization 2 (two) times daily. DX.J44.1 06/08/17 06/08/18 Yes Kasa, Wallis Bamberg, MD  cholecalciferol (VITAMIN D) 1000 units tablet Take 1,000 Units by mouth daily.   Yes [provider]  colchicine 0.6 MG tablet Take 0.6 mg by mouth 2 (two) times daily.   Yes [provider]  donepezil (ARICEPT) 5 MG tablet Take 5 mg by mouth at bedtime.   Yes [provider]  ferrous sulfate 325 (65 FE) MG tablet Take 325 mg by mouth daily with breakfast.   Yes [provider]  folic acid (FOLVITE) 1 MG tablet Take 1 mg by mouth daily.   Yes [provider]  formoterol (PERFOROMIST) 20 MCG/2ML nebulizer solution Take 2 mLs (20 mcg total) by nebulization 2 (two) times daily. DX:J44.1 06/08/17  Yes Erin Fulling, MD  loratadine (CLARITIN) 10 MG tablet  Take 10 mg by mouth daily.   Yes [provider]  LORazepam (ATIVAN) 0.5 MG tablet Take 1 tablet (0.5 mg total) by mouth 2 (two) times daily as needed for anxiety. 03/29/17  Yes Gouru, Deanna Artis, MD  mirtazapine (REMERON) 7.5 MG tablet Take 7.5 mg by mouth at bedtime.   Yes [provider]  pravastatin (PRAVACHOL) 10 MG tablet Take 10 mg by mouth at bedtime.   Yes [provider]  QUEtiapine (SEROQUEL) 50 MG tablet Take 50 mg by mouth 2 (two)  times daily.   Yes [provider]  Fluticasone-Salmeterol (ADVAIR) 250-50 MCG/DOSE AEPB Inhale 1 puff into the lungs 2 (two) times daily.    [provider]    Allergies Patient has no known allergies.  Family History  Problem Relation Age of Onset  . Family history unknown: Yes    Social History Social History  Substance Use Topics  . Smoking status: Current Some Day Smoker  . Smokeless tobacco: Never Used  . Alcohol use No    Review of Systems Unable to obtain ____________________________________________   PHYSICAL EXAM:  VITAL SIGNS: ED Triage Vitals  Enc Vitals Group     BP 08/11/17 2225 111/68     Pulse Rate 08/11/17 2225 70     Resp 08/11/17 2225 14     Temp 08/11/17 2225 97.6 F (36.4 C)     Temp Source 08/11/17 2225 Oral     SpO2 08/11/17 2225 94 %     Weight 08/11/17 2218 133 lb 12.8 oz (60.7 kg)     Height 08/11/17 2218 5\' 5"  (1.651 m)     Head Circumference --      Peak Flow --      Pain Score --      Pain Loc --      Pain Edu? --      Excl. in GC? --    Constitutional: Alert and orientedTo person only. Well appearing and in no acute distress. Eyes: Conjunctivae are normal.  Head: Atraumatic. Nose: No congestion/rhinnorhea. Mouth/Throat: Mucous membranes are moist.  Oropharynx non-erythematous. Neck: No stridor. Cardiovascular: Normal rate, regular rhythm. Grossly normal heart sounds.  Good peripheral circulation. Respiratory: Normal respiratory effort.  No retractions. Lungs CTAB. Gastrointestinal: Soft and nontender. No distention. No abdominal bruits. No CVA tenderness. Musculoskeletal: No lower extremity tenderness nor edema.  No joint effusions. Neurologic:  Normal speech and language. No gross focal neurologic deficits are appreciated. Skin:  Skin is warm, dry and intact. No rash noted. Psychiatric: Mood and affect are normal. Speech and behavior are normal.  ____________________________________________   LABS (all  labs ordered are listed, but only abnormal results are displayed)  Labs Reviewed  COMPREHENSIVE METABOLIC PANEL - Abnormal; Notable for the following:       Result Value   Chloride 115 (*)    Glucose, Bld 148 (*)    Creatinine, Ser 1.24 (*)    Total Protein 6.4 (*)    Albumin 3.1 (*)    GFR calc non Af Amer 42 (*)    GFR calc Af Amer 49 (*)    All other components within normal limits  CBC - Abnormal; Notable for the following:    RBC 3.49 (*)    Hemoglobin 10.1 (*)    HCT 30.5 (*)    RDW 15.3 (*)    All other components within normal limits  URINALYSIS, COMPLETE (UACMP) WITH MICROSCOPIC - Abnormal; Notable for the following:    Color, Urine YELLOW (*)  APPearance CLEAR (*)    Squamous Epithelial / LPF 0-5 (*)    All other components within normal limits  GLUCOSE, CAPILLARY - Abnormal; Notable for the following:    Glucose-Capillary 153 (*)    All other components within normal limits  GASTROINTESTINAL PANEL BY PCR, STOOL (REPLACES STOOL CULTURE)  C DIFFICILE QUICK SCREEN W PCR REFLEX  TROPONIN I  LACTIC ACID, PLASMA  DIFFERENTIAL  TYPE AND SCREEN   ____________________________________________  EKG  EKG read and interpreted by me shows normal sinus rhythm rate of 72 normal axis no acute ST-T wave changes very poor baseline ____________________________________________  RADIOLOGY IMPRESSION: Stable cardiomegaly with tortuous atherosclerotic thoracic aorta. Improved bibasilar aeration with minimal bibasilar atelectasis.   Electronically Signed   By: Rubye Oaks M.D.   On: 08/11/2017 23:12 ____________________________________________   PROCEDURES  Procedure(s) performed:   Procedures  Critical Care performed:   ____________________________________________   INITIAL IMPRESSION / ASSESSMENT AND PLAN / ED COURSE  Pertinent labs & imaging results that were available during my care of the patient were reviewed by me and considered in my medical  decision making (see chart for details).  At this point multiple labs are still pending L sinus patient to Dr. Derrill Kay     ____________________________________________   FINAL CLINICAL IMPRESSION(S) / ED DIAGNOSES  Final diagnoses:  Hypotension, unspecified hypotension type  Syncope, unspecified syncope type      NEW MEDICATIONS STARTED DURING THIS VISIT:  New Prescriptions   No medications on file     Note:  This document was prepared using Dragon voice recognition software and may include unintentional dictation errors.    Arnaldo Natal, MD 08/11/17 2354    Arnaldo Natal, MD 08/12/17 0001

## 2017-08-11 NOTE — ED Triage Notes (Signed)
PT to rm 25 via EMS from [facility], report hypotensive over past couple days, pt initially unresponsive with BP 62/37, given NS, pressure improve to 111/72.  Pt with black tarry stool upon arrival.

## 2017-08-12 DIAGNOSIS — R55 Syncope and collapse: Secondary | ICD-10-CM | POA: Diagnosis present

## 2017-08-12 LAB — CBC
HCT: 31.4 % — ABNORMAL LOW (ref 35.0–47.0)
HEMOGLOBIN: 10.6 g/dL — AB (ref 12.0–16.0)
MCH: 29.6 pg (ref 26.0–34.0)
MCHC: 33.8 g/dL (ref 32.0–36.0)
MCV: 87.6 fL (ref 80.0–100.0)
PLATELETS: 280 10*3/uL (ref 150–440)
RBC: 3.58 MIL/uL — ABNORMAL LOW (ref 3.80–5.20)
RDW: 15.5 % — ABNORMAL HIGH (ref 11.5–14.5)
WBC: 7.2 10*3/uL (ref 3.6–11.0)

## 2017-08-12 LAB — BASIC METABOLIC PANEL
ANION GAP: 5 (ref 5–15)
BUN: 15 mg/dL (ref 6–20)
CALCIUM: 10.4 mg/dL — AB (ref 8.9–10.3)
CO2: 23 mmol/L (ref 22–32)
CREATININE: 0.99 mg/dL (ref 0.44–1.00)
Chloride: 115 mmol/L — ABNORMAL HIGH (ref 101–111)
GFR, EST NON AFRICAN AMERICAN: 55 mL/min — AB (ref >60)
GLUCOSE: 93 mg/dL (ref 65–99)
Potassium: 4.2 mmol/L (ref 3.5–5.1)
Sodium: 143 mmol/L (ref 135–145)

## 2017-08-12 LAB — MRSA PCR SCREENING: MRSA by PCR: NEGATIVE

## 2017-08-12 LAB — TROPONIN I

## 2017-08-12 LAB — TSH: TSH: 0.788 u[IU]/mL (ref 0.350–4.500)

## 2017-08-12 MED ORDER — QUETIAPINE FUMARATE 25 MG PO TABS
50.0000 mg | ORAL_TABLET | Freq: Two times a day (BID) | ORAL | Status: DC
  Administered 2017-08-12 – 2017-08-13 (×3): 50 mg via ORAL
  Filled 2017-08-12 (×3): qty 2

## 2017-08-12 MED ORDER — MIRTAZAPINE 15 MG PO TABS
7.5000 mg | ORAL_TABLET | Freq: Every day | ORAL | Status: DC
  Administered 2017-08-12: 7.5 mg via ORAL
  Filled 2017-08-12: qty 1

## 2017-08-12 MED ORDER — DONEPEZIL HCL 5 MG PO TABS
5.0000 mg | ORAL_TABLET | Freq: Every day | ORAL | Status: DC
  Administered 2017-08-12: 5 mg via ORAL
  Filled 2017-08-12 (×2): qty 1

## 2017-08-12 MED ORDER — COLCHICINE 0.6 MG PO TABS
0.6000 mg | ORAL_TABLET | Freq: Two times a day (BID) | ORAL | Status: DC
  Administered 2017-08-12 – 2017-08-13 (×3): 0.6 mg via ORAL
  Filled 2017-08-12 (×3): qty 1

## 2017-08-12 MED ORDER — LORATADINE 10 MG PO TABS
10.0000 mg | ORAL_TABLET | Freq: Every day | ORAL | Status: DC
  Administered 2017-08-12 – 2017-08-13 (×2): 10 mg via ORAL
  Filled 2017-08-12 (×2): qty 1

## 2017-08-12 MED ORDER — ONDANSETRON HCL 4 MG/2ML IJ SOLN: 4.0000 mg | Freq: Four times a day (QID) | INTRAMUSCULAR | Status: DC | PRN

## 2017-08-12 MED ORDER — MOMETASONE FURO-FORMOTEROL FUM 200-5 MCG/ACT IN AERO
2.0000 | INHALATION_SPRAY | Freq: Two times a day (BID) | RESPIRATORY_TRACT | Status: DC
  Administered 2017-08-12 – 2017-08-13 (×3): 2 via RESPIRATORY_TRACT
  Filled 2017-08-12: qty 8.8

## 2017-08-12 MED ORDER — ARFORMOTEROL TARTRATE 15 MCG/2ML IN NEBU
15.0000 ug | INHALATION_SOLUTION | Freq: Two times a day (BID) | RESPIRATORY_TRACT | Status: DC
  Administered 2017-08-12 – 2017-08-13 (×3): 15 ug via RESPIRATORY_TRACT
  Filled 2017-08-12 (×4): qty 2

## 2017-08-12 MED ORDER — ALBUTEROL SULFATE (2.5 MG/3ML) 0.083% IN NEBU: 2.5000 mg | INHALATION_SOLUTION | Freq: Four times a day (QID) | RESPIRATORY_TRACT | Status: DC | PRN

## 2017-08-12 MED ORDER — MAGNESIUM CITRATE PO SOLN
1.0000 | Freq: Once | ORAL | Status: DC | PRN
  Filled 2017-08-12: qty 296

## 2017-08-12 MED ORDER — IPRATROPIUM BROMIDE 0.02 % IN SOLN: 0.5000 mg | Freq: Four times a day (QID) | RESPIRATORY_TRACT | Status: DC | PRN

## 2017-08-12 MED ORDER — ENOXAPARIN SODIUM 40 MG/0.4ML ~~LOC~~ SOLN
40.0000 mg | SUBCUTANEOUS | Status: DC
  Administered 2017-08-12 – 2017-08-13 (×2): 40 mg via SUBCUTANEOUS
  Filled 2017-08-12 (×2): qty 0.4

## 2017-08-12 MED ORDER — BENZONATATE 100 MG PO CAPS: 200.0000 mg | ORAL_CAPSULE | Freq: Three times a day (TID) | ORAL | Status: DC | PRN

## 2017-08-12 MED ORDER — ASPIRIN EC 81 MG PO TBEC
81.0000 mg | DELAYED_RELEASE_TABLET | Freq: Every day | ORAL | Status: DC
  Administered 2017-08-12 – 2017-08-13 (×2): 81 mg via ORAL
  Filled 2017-08-12 (×2): qty 1

## 2017-08-12 MED ORDER — SODIUM CHLORIDE 0.9 % IV SOLN
INTRAVENOUS | Status: DC
  Administered 2017-08-12: 05:00:00 via INTRAVENOUS

## 2017-08-12 MED ORDER — PRAVASTATIN SODIUM 20 MG PO TABS
10.0000 mg | ORAL_TABLET | Freq: Every day | ORAL | Status: DC
  Administered 2017-08-12: 10 mg via ORAL
  Filled 2017-08-12: qty 1

## 2017-08-12 MED ORDER — ACETAMINOPHEN 500 MG PO TABS
500.0000 mg | ORAL_TABLET | Freq: Two times a day (BID) | ORAL | Status: DC
  Administered 2017-08-12 – 2017-08-13 (×3): 500 mg via ORAL
  Filled 2017-08-12 (×3): qty 1

## 2017-08-12 MED ORDER — ONDANSETRON HCL 4 MG PO TABS: 4.0000 mg | ORAL_TABLET | Freq: Four times a day (QID) | ORAL | Status: DC | PRN

## 2017-08-12 MED ORDER — BUDESONIDE 0.5 MG/2ML IN SUSP
0.5000 mg | Freq: Two times a day (BID) | RESPIRATORY_TRACT | Status: DC
  Administered 2017-08-12 – 2017-08-13 (×3): 0.5 mg via RESPIRATORY_TRACT
  Filled 2017-08-12 (×3): qty 2

## 2017-08-12 MED ORDER — BISACODYL 5 MG PO TBEC: 5.0000 mg | DELAYED_RELEASE_TABLET | Freq: Every day | ORAL | Status: DC | PRN

## 2017-08-12 MED ORDER — LORAZEPAM 0.5 MG PO TABS: 0.5000 mg | ORAL_TABLET | Freq: Two times a day (BID) | ORAL | Status: DC | PRN

## 2017-08-12 MED ORDER — FOLIC ACID 1 MG PO TABS
1.0000 mg | ORAL_TABLET | Freq: Every day | ORAL | Status: DC
  Administered 2017-08-12 – 2017-08-13 (×2): 1 mg via ORAL
  Filled 2017-08-12 (×2): qty 1

## 2017-08-12 MED ORDER — SODIUM CHLORIDE 0.9% FLUSH
3.0000 mL | Freq: Two times a day (BID) | INTRAVENOUS | Status: DC
  Administered 2017-08-12 – 2017-08-13 (×4): 3 mL via INTRAVENOUS

## 2017-08-12 MED ORDER — SENNOSIDES-DOCUSATE SODIUM 8.6-50 MG PO TABS: 1.0000 | ORAL_TABLET | Freq: Every evening | ORAL | Status: DC | PRN

## 2017-08-12 MED ORDER — FERROUS SULFATE 325 (65 FE) MG PO TABS
325.0000 mg | ORAL_TABLET | Freq: Every day | ORAL | Status: DC
  Administered 2017-08-12: 325 mg via ORAL
  Filled 2017-08-12 (×2): qty 1

## 2017-08-12 MED ORDER — VITAMIN D 1000 UNITS PO TABS
1000.0000 [IU] | ORAL_TABLET | Freq: Every day | ORAL | Status: DC
  Administered 2017-08-12 – 2017-08-13 (×2): 1000 [IU] via ORAL
  Filled 2017-08-12 (×2): qty 1

## 2017-08-12 NOTE — Progress Notes (Signed)
Unable to obtain answers for admissions questions as patient is confused and unable to answer. No family is present.

## 2017-08-12 NOTE — ED Notes (Signed)
Pt continues to remove pulse ox despite distraction.  Cardiac leads rearranged, pt not removing those at this time

## 2017-08-12 NOTE — Care Management Obs Status (Signed)
MEDICARE OBSERVATION STATUS NOTIFICATION   Patient Details  Name: Candice Randall MRN: [id number] Date of Birth: Apr 17, 1946   Medicare Observation Status Notification Given:  Yes    Brodyn Depuy A, RN 08/12/2017, 4:02 PM

## 2017-08-12 NOTE — ED Notes (Signed)
Pt has pulled off cardiac leads, pt gets agitated when try to redirect and place leads back on

## 2017-08-12 NOTE — ED Notes (Signed)
Per Dr. Darnelle Catalan, continue to let EMS's bolus run, pt received 1.5L in total of NS

## 2017-08-12 NOTE — [place] FL2 (Signed)
[place] MEDICAID FL2 LEVEL OF CARE SCREENING TOOL     IDENTIFICATION  Patient Name: Candice Randall Birthdate: [birth year] Sex: female Admission Date (Current Location): 08/11/2017  [facility] and [place] Number:  Chiropodist and Address:  [facility], [address], [place], [place] [address]      Provider Number: [id number]  Attending Physician Name and Address:  Enedina Finner, MD  Relative Name and Phone Number:       Current Level of Care: Hospital Recommended Level of Care: Memory Care Prior Approval Number:    Date Approved/Denied: 11/09/16 PASRR Number: [id number]  Discharge Plan: Domiciliary (Rest home) (Memory Care Unit)    Current Diagnoses: Patient Active Problem List   Diagnosis Date Noted  . Syncope and collapse 08/12/2017  . SOB (shortness of breath) 05/09/2017  . Syncope 05/09/2017  . Sepsis (HCC) 03/25/2017  . HCAP (healthcare-associated pneumonia) 03/25/2017  . Dementia 03/25/2017  . HLD (hyperlipidemia) 03/25/2017  . Dehydration 07/28/2016    Orientation RESPIRATION BLADDER Height & Weight     Self  Normal Incontinent Weight: 122 lb (55.3 kg) Height:  5\' 5"  (165.1 cm)  BEHAVIORAL SYMPTOMS/MOOD NEUROLOGICAL BOWEL NUTRITION STATUS      Incontinent, Continent    AMBULATORY STATUS COMMUNICATION OF NEEDS Skin   Supervision Verbally Normal                       Personal Care Assistance Level of Assistance  Bathing, Feeding, Dressing Bathing Assistance: Limited assistance Feeding assistance: Independent Dressing Assistance: Limited assistance     Functional Limitations Info             SPECIAL CARE FACTORS FREQUENCY                       Contractures Contractures Info: Not present    Additional Factors Info  Code Status, Allergies Code Status Info: Full Allergies Info: No Known Allergies           Current Medications (08/12/2017):  This is the current hospital active medication  list Current Facility-Administered Medications  Medication Dose Route Frequency Provider Last Rate Last Dose  . acetaminophen (TYLENOL) tablet 500 mg  500 mg Oral BID Hugelmeyer, Alexis, DO   500 mg at 08/12/17 0900  . albuterol (PROVENTIL) (2.5 MG/3ML) 0.083% nebulizer solution 2.5 mg  2.5 mg Inhalation Q6H PRN Hugelmeyer, Alexis, DO      . albuterol (PROVENTIL) (2.5 MG/3ML) 0.083% nebulizer solution 2.5 mg  2.5 mg Nebulization Q6H PRN Hugelmeyer, Alexis, DO      . arformoterol (BROVANA) nebulizer solution 15 mcg  15 mcg Nebulization Q12H Hugelmeyer, Alexis, DO   15 mcg at 08/12/17 0737  . aspirin EC tablet 81 mg  81 mg Oral Daily Hugelmeyer, Alexis, DO   81 mg at 08/12/17 0901  . benzonatate (TESSALON) capsule 200 mg  200 mg Oral TID PRN Hugelmeyer, Alexis, DO      . bisacodyl (DULCOLAX) EC tablet 5 mg  5 mg Oral Daily PRN Hugelmeyer, Alexis, DO      . budesonide (PULMICORT) nebulizer solution 0.5 mg  0.5 mg Nebulization BID Hugelmeyer, Alexis, DO   0.5 mg at 08/12/17 0738  . cholecalciferol (VITAMIN D) tablet 1,000 Units  1,000 Units Oral Daily Hugelmeyer, Alexis, DO   1,000 Units at 08/12/17 0859  . colchicine tablet 0.6 mg  0.6 mg Oral BID Hugelmeyer, Alexis, DO   0.6 mg at 08/12/17 0900  . donepezil (ARICEPT)  tablet 5 mg  5 mg Oral QHS Hugelmeyer, Alexis, DO      . enoxaparin (LOVENOX) injection 40 mg  40 mg Subcutaneous Q24H Hugelmeyer, Alexis, DO   40 mg at 08/12/17 0451  . ferrous sulfate tablet 325 mg  325 mg Oral Q breakfast Hugelmeyer, Alexis, DO   325 mg at 08/12/17 0900  . folic acid (FOLVITE) tablet 1 mg  1 mg Oral Daily Hugelmeyer, Alexis, DO   1 mg at 08/12/17 0900  . ipratropium (ATROVENT) nebulizer solution 0.5 mg  0.5 mg Nebulization Q6H PRN Hugelmeyer, Alexis, DO      . loratadine (CLARITIN) tablet 10 mg  10 mg Oral Daily Hugelmeyer, Alexis, DO   10 mg at 08/12/17 0900  . LORazepam (ATIVAN) tablet 0.5 mg  0.5 mg Oral BID PRN Hugelmeyer, Alexis, DO      . magnesium citrate  solution 1 Bottle  1 Bottle Oral Once PRN Hugelmeyer, Alexis, DO      . mirtazapine (REMERON) tablet 7.5 mg  7.5 mg Oral QHS Hugelmeyer, Alexis, DO      . mometasone-formoterol (DULERA) 200-5 MCG/ACT inhaler 2 puff  2 puff Inhalation BID Hugelmeyer, Alexis, DO   2 puff at 08/12/17 0858  . ondansetron (ZOFRAN) tablet 4 mg  4 mg Oral Q6H PRN Hugelmeyer, Alexis, DO       Or  . ondansetron (ZOFRAN) injection 4 mg  4 mg Intravenous Q6H PRN Hugelmeyer, Alexis, DO      . pravastatin (PRAVACHOL) tablet 10 mg  10 mg Oral QHS Hugelmeyer, Alexis, DO      . QUEtiapine (SEROQUEL) tablet 50 mg  50 mg Oral BID Hugelmeyer, Alexis, DO   50 mg at 08/12/17 0900  . senna-docusate (Senokot-S) tablet 1 tablet  1 tablet Oral QHS PRN Hugelmeyer, Alexis, DO      . sodium chloride flush (NS) 0.9 % injection 3 mL  3 mL Intravenous Q12H Hugelmeyer, Alexis, DO   3 mL at 08/12/17 0901     Discharge Medications: CONTINUE these medications which have NOT CHANGED   Details  acetaminophen (TYLENOL) 500 MG tablet Take 500 mg by mouth 2 (two) times daily.    albuterol (PROVENTIL HFA;VENTOLIN HFA) 108 (90 Base) MCG/ACT inhaler Inhale 1 puff into the lungs every 6 (six) hours as needed for wheezing or shortness of breath.    albuterol (PROVENTIL) (2.5 MG/3ML) 0.083% nebulizer solution Take 3 mLs (2.5 mg total) by nebulization every 6 (six) hours as needed for wheezing or shortness of breath. Qty: 75 mL, Refills: 12    AMBULATORY NON FORMULARY MEDICATION Medication Name: dispense nebulizer machine Dx:J44.9 Qty: 1 Device, Refills: 0    aspirin EC 81 MG tablet Take 81 mg by mouth daily.    benzonatate (TESSALON) 200 MG capsule Take 1 capsule (200 mg total) by mouth 3 (three) times daily as needed for cough. Qty: 20 capsule, Refills: 0    budesonide (PULMICORT) 0.5 MG/2ML nebulizer solution Take 2 mLs (0.5 mg total) by nebulization 2 (two) times daily. DX.J44.1 Qty: 360 mL, Refills: 5   Associated Diagnoses: Chronic  obstructive pulmonary disease, unspecified COPD type (HCC)    cholecalciferol (VITAMIN D) 1000 units tablet Take 1,000 Units by mouth daily.    colchicine 0.6 MG tablet Take 0.6 mg by mouth 2 (two) times daily.    donepezil (ARICEPT) 5 MG tablet Take 5 mg by mouth at bedtime.    ferrous sulfate 325 (65 FE) MG tablet Take 325 mg by mouth daily with  breakfast.    folic acid (FOLVITE) 1 MG tablet Take 1 mg by mouth daily.    formoterol (PERFOROMIST) 20 MCG/2ML nebulizer solution Take 2 mLs (20 mcg total) by nebulization 2 (two) times daily. DX:J44.1 Qty: 360 mL, Refills: 5   Associated Diagnoses: Chronic obstructive pulmonary disease, unspecified COPD type (HCC)    loratadine (CLARITIN) 10 MG tablet Take 10 mg by mouth daily.    LORazepam (ATIVAN) 0.5 MG tablet Take 1 tablet (0.5 mg total) by mouth 2 (two) times daily as needed for anxiety. Qty: 15 tablet, Refills: 0    mirtazapine (REMERON) 7.5 MG tablet Take 7.5 mg by mouth at bedtime.    pravastatin (PRAVACHOL) 10 MG tablet Take 10 mg by mouth at bedtime.    QUEtiapine (SEROQUEL) 50 MG tablet Take 50 mg by mouth 2 (two) times daily.    Fluticasone-Salmeterol (ADVAIR) 250-50 MCG/DOSE AEPB Inhale 1 puff into the lungs 2 (two) times daily.      Relevant Imaging Results:  Relevant Lab Results:   Additional Information SS# [id number]  Judi Cong, LCSW

## 2017-08-12 NOTE — Progress Notes (Signed)
PT Cancellation Note  Patient Details Name: Candice Randall MRN: [id number] DOB: October 07, 1946   Cancelled Treatment:    Reason Eval/Treat Not Completed: Other (comment). PT entered room, patient is quite confused. She reports she is getting ready to go to work. PT asks multiple times, in multiple ways for patient to sit at the edge of the bed. She continues to refuse and becomes increasingly agitated with attempts. PT will re-attempt at a later time/date as appropriate.   Alva Garnet PT, DPT, CSCS    08/12/2017, 9:38 AM

## 2017-08-12 NOTE — Progress Notes (Signed)
[facility] [place] [facility]   PATIENT NAME: Candice Randall    MR#:  [id number]  DATE OF BIRTH:  06/28/1946  SUBJECTIVE:  Pleasantly confused due to dementia. Unable to give any history or ROS Eating BF No diarrhea per RN  REVIEW OF SYSTEMS:   Review of Systems  Unable to perform ROS: Dementia   Tolerating Diet:yes Tolerating PT: did not cooperate to participate  DRUG ALLERGIES:  No Known Allergies  VITALS:  Blood pressure 120/68, pulse 68, temperature 98 F (36.7 C), temperature source Oral, resp. rate 18, height 5\' 5"  (1.651 m), weight 55.3 kg (122 lb), SpO2 100 %.  PHYSICAL EXAMINATION:   Physical Exam  GENERAL:  71 y.o.-year-old patient lying in the bed with no acute distress.  EYES: Pupils equal, round, reactive to light and accommodation. No scleral icterus. Extraocular muscles intact.  HEENT: Head atraumatic, normocephalic. Oropharynx and nasopharynx clear.  NECK:  Supple, no jugular venous distention. No thyroid enlargement, no tenderness.  LUNGS: Normal breath sounds bilaterally, no wheezing, rales, rhonchi. No use of accessory muscles of respiration.  CARDIOVASCULAR: S1, S2 normal. No murmurs, rubs, or gallops.  ABDOMEN: Soft, nontender, nondistended. Bowel sounds present. No organomegaly or mass.  EXTREMITIES: No cyanosis, clubbing or edema b/l.    NEUROLOGIC: Cranial nerves II through XII are intact. No focal Motor or sensory deficits b/l.   PSYCHIATRIC:  patient is alert and confused  SKIN: No obvious rash, lesion, or ulcer.   LABORATORY PANEL:  CBC  Recent Labs Lab 08/12/17 0439  WBC 7.2  HGB 10.6*  HCT 31.4*  PLT 280    Chemistries   Recent Labs Lab 08/11/17 2239 08/12/17 0439  NA 143 143  K 4.4 4.2  CL 115* 115*  CO2 23 23  GLUCOSE 148* 93  BUN 16 15  CREATININE 1.24* 0.99  CALCIUM 10.0 10.4*  AST 40  --   ALT 22  --   ALKPHOS 115  --   BILITOT 0.6  --    Cardiac Enzymes  Recent Labs Lab  08/12/17 1054  TROPONINI <0.03   RADIOLOGY:  Dg Chest Portable 1 View  Result Date: 08/11/2017 CLINICAL DATA:  Weakness. EXAM: PORTABLE CHEST 1 VIEW COMPARISON:  05/08/2017 FINDINGS: The cardiomediastinal contours are unchanged with mild cardiomegaly and tortuous atherosclerotic thoracic aorta. Improved bibasilar aeration from prior exam, minimal residual atelectasis or scarring. No consolidation, pleural effusion, or pneumothorax. The bones are under mineralized. Left thyroid calcification. No acute osseous abnormalities are seen. IMPRESSION: Stable cardiomegaly with tortuous atherosclerotic thoracic aorta. Improved bibasilar aeration with minimal bibasilar atelectasis. Electronically Signed   By: Rubye Oaks M.D.   On: 08/11/2017 23:12   ASSESSMENT AND PLAN:  71 y.o. female with a history of COPD, dementia, hyperlipidemia, vitamin D deficiency now being admitted with:  #. Syncope likely secondary to hypotension, hypovolemia due to diarrhea -received  IV fluid hydration -BP stable -no complaints or issues per RN  # Diarrhea. Likely dark from iron.  FOBT (-) x 2 in ED -resolved none since admission  #. Mild renal insufficiency likely secondary to dehydration -IV fluid hydration Creat back to normal  #. Anemia, iron deficiency - chronic and stable Continue iron  #. History of COPD - Continue Pulmicort, Advair, Perforomist, Claritin -O2 and nebs as needed  #. History of gout - Continue colchicine  #. History of dementia - Continue Aricept, Ativan, Remeron, Seroquel  #. History of hyperlipidemia - Continue pravastatin  If remains stable d/c  to facility in am CSW aware Case discussed with Care Management/Social Worker. Management plans discussed with the patient, family and they are in agreement.  CODE STATUS: FULL  DVT Prophylaxis: lovenox  TOTAL TIME TAKING CARE OF THIS PATIENT: 25 minutes.  >50% time spent on counselling and coordination of  care  POSSIBLE D/C IN 1 DAYS, DEPENDING ON CLINICAL CONDITION.  Note: This dictation was prepared with Dragon dictation along with smaller phrase technology. Any transcriptional errors that result from this process are unintentional.  Desani Sprung M.D on 08/12/2017 at 1:05 PM  Between 7am to 6pm - Pager - [phone]  After 6pm go to [email or web address] - password [email or web address] [facility]  [facility]  Office  [phone]  CC: Primary care physician; Clovis Pu, Arturo Morton, DO

## 2017-08-12 NOTE — ED Notes (Signed)
Pt continually removing pulse ox and cardiac leads, pt given activity vest to try to distract.

## 2017-08-12 NOTE — Clinical Social Work Note (Signed)
Clinical Social Work Assessment  Patient Details  Name: Candice Randall MRN: [id number] Date of Birth: 12-May-1946  Date of referral:  08/12/17               Reason for consult:  Facility Placement                Permission sought to share information with:  Oceanographer granted to share information::  Yes, Verbal Permission Granted  Name::        Agency::  [facility]  Relationship::     Contact Information:     Housing/Transportation Living arrangements for the past 2 months:  Assisted Dealer of Information:  Facility, Medical Team Patient Interpreter Needed:  None Criminal Activity/Legal Involvement Pertinent to Current Situation/Hospitalization:  No - Comment as needed 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  Yes Need for family participation in patient care:  Yes (Comment) (Patient has dementia)  Care giving concerns:  Patient admitted from ALF/[facility]   Social Worker assessment / plan:  CSW attempted to speak with the patient at bedside. Currently, the patient is only oriented to self and unable to participate. The CSW attempted to contact family with no success and no ability to leave voicemail. The CSW contacted the facility [facility]). According to the med tech, the patient can return tomorrow and typically travels by EMS. CSW will facilitate discharge once the patient is stable.   Employment status:  Retired Health and safety inspector:  Medicare PT Recommendations:  Not assessed at this time Information / Referral to community resources:     Patient/Family's Response to care:  Patient unable to participate and family not available.  Patient/Family's Understanding of and Emotional Response to Diagnosis, Current Treatment, and Prognosis:  The patient has dementia and is not able to understand nuances of her care.  The patient's family was not available.  Emotional Assessment Appearance:  Appears stated age Attitude/Demeanor/Rapport:  Lethargic Affect (typically observed):  Accepting, Appropriate, Pleasant Orientation:    Alcohol / Substance use:  Never Used Psych involvement (Current and /or in the community):  No (Comment)  Discharge Needs  Concerns to be addressed:  Care Coordination, Discharge Planning Concerns Readmission within the last 30 days:  No Current discharge risk:  Cognitively Impaired Barriers to Discharge:  Continued Medical Work up   [employer], LCSW 08/12/2017, 2:11 PM

## 2017-08-12 NOTE — H&P (Addendum)
History and Physical   [facility] - [place] @ [facility] Admission History and Physical [employer], D.O.    Patient Name: Candice Randall MR#: [id number] Date of Birth: [birth year] Date of Admission: 08/11/2017  Referring MD/NP/PA: Dr. Darnelle Catalan Primary Care Physician: Clovis Pu, Arturo Morton, DO  Chief Complaint:  Chief Complaint  Patient presents with  . Hypotension  Please note the entire history is obtained from the patient's emergency department chart, emergency department provider and prior records. Patient's personal history is limited by dementia.   HPI: Candice Randall is a 71 y.o. female with a known history of COPD, dementia, hyperlipidemia, vitamin D deficiency presents to the emergency department for evaluation of syncope.  Patient was in a usual state of health until this evening she was found unresponsive in her nursing care facility and was found to have a blood pressure of 62/37 while sitting down. In the emergency department she has been awake and alert but disoriented. She was also found to have dark black diarrhea which was Hemoccult negative 2 in the emergency department.  Patient denies fevers/chills, weakness, dizziness, chest pain, shortness of breath, N/V/C/D, abdominal pain, dysuria/frequency, changes in mental status.     Review of Systems:  Unable to obtain secondary to dementia   Past Medical History:  Diagnosis Date  . COPD (chronic obstructive pulmonary disease) (HCC)   . Dementia   . Hyperlipidemia   . Vitamin D deficiency     Past Surgical History:  Procedure Laterality Date  . NO PAST SURGERIES       reports that she has been smoking.  She has never used smokeless tobacco. She reports that she does not drink alcohol or use drugs.  No Known Allergies  Family History  Problem Relation Age of Onset  . Family history unknown: Yes    Prior to Admission medications   Medication Sig Start Date End Date Taking? Authorizing Provider   acetaminophen (TYLENOL) 500 MG tablet Take 500 mg by mouth 2 (two) times daily.   Yes [provider]  albuterol (PROVENTIL HFA;VENTOLIN HFA) 108 (90 Base) MCG/ACT inhaler Inhale 1 puff into the lungs every 6 (six) hours as needed for wheezing or shortness of breath.   Yes [provider]  albuterol (PROVENTIL) (2.5 MG/3ML) 0.083% nebulizer solution Take 3 mLs (2.5 mg total) by nebulization every 6 (six) hours as needed for wheezing or shortness of breath. 03/29/17  Yes Gouru, Deanna Artis, MD  AMBULATORY NON FORMULARY MEDICATION Medication Name: dispense nebulizer machine Dx:J44.9 06/15/17  Yes Erin Fulling, MD  aspirin EC 81 MG tablet Take 81 mg by mouth daily.   Yes [provider]  benzonatate (TESSALON) 200 MG capsule Take 1 capsule (200 mg total) by mouth 3 (three) times daily as needed for cough. 03/29/17  Yes Gouru, Aruna, MD  budesonide (PULMICORT) 0.5 MG/2ML nebulizer solution Take 2 mLs (0.5 mg total) by nebulization 2 (two) times daily. DX.J44.1 06/08/17 06/08/18 Yes Kasa, Wallis Bamberg, MD  cholecalciferol (VITAMIN D) 1000 units tablet Take 1,000 Units by mouth daily.   Yes [provider]  colchicine 0.6 MG tablet Take 0.6 mg by mouth 2 (two) times daily.   Yes [provider]  donepezil (ARICEPT) 5 MG tablet Take 5 mg by mouth at bedtime.   Yes [provider]  ferrous sulfate 325 (65 FE) MG tablet Take 325 mg by mouth daily with breakfast.   Yes [provider]  folic acid (FOLVITE) 1 MG tablet Take 1 mg by mouth  daily.   Yes [provider]  formoterol (PERFOROMIST) 20 MCG/2ML nebulizer solution Take 2 mLs (20 mcg total) by nebulization 2 (two) times daily. DX:J44.1 06/08/17  Yes Erin Fulling, MD  loratadine (CLARITIN) 10 MG tablet Take 10 mg by mouth daily.   Yes [provider]  LORazepam (ATIVAN) 0.5 MG tablet Take 1 tablet (0.5 mg total) by mouth 2 (two) times daily as needed for anxiety. 03/29/17  Yes Gouru, Deanna Artis, MD   mirtazapine (REMERON) 7.5 MG tablet Take 7.5 mg by mouth at bedtime.   Yes [provider]  pravastatin (PRAVACHOL) 10 MG tablet Take 10 mg by mouth at bedtime.   Yes [provider]  QUEtiapine (SEROQUEL) 50 MG tablet Take 50 mg by mouth 2 (two) times daily.   Yes [provider]  Fluticasone-Salmeterol (ADVAIR) 250-50 MCG/DOSE AEPB Inhale 1 puff into the lungs 2 (two) times daily.    [provider]    Physical Exam: Vitals:   08/11/17 2300 08/11/17 2330 08/12/17 0001 08/12/17 0058  BP: (!) 100/58 90/70 (!) 122/96 105/68  Pulse: 72   67  Resp: (!) 8 19 (!) 24 14  Temp:      TempSrc:      SpO2: 96%   95%  Weight:      Height:        GENERAL: 71 y.o.-year-old female patient, well-developed, well-nourished lying in the bed in no acute distress.  Pleasant and cooperative.   HEENT: Head atraumatic, normocephalic. Pupils equal. Mucus membranes dry. NECK: Supple, full range of motion. No JVD, no bruit heard. No thyroid enlargement, no tenderness, no cervical lymphadenopathy. CHEST: Normal breath sounds bilaterally. No wheezing, rales, rhonchi or crackles. No use of accessory muscles of respiration.  No reproducible chest wall tenderness.  CARDIOVASCULAR: regular, positive S1-S2. Cap refill <2 seconds. Pulses intact distally.  ABDOMEN: Soft, nondistended, nontender. No rebound, guarding, rigidity. Normoactive bowel sounds present in all four quadrants.  EXTREMITIES: No pedal edema, cyanosis, or clubbing. No calf tenderness or Homan's sign.  NEUROLOGIC: The patient is alert and oriented to person only.. Cranial nerves II through XII are grossly intact with no focal sensorimotor deficit. SKIN: Warm, dry, and intact without obvious rash, lesion, or ulcer.    Labs on Admission:  CBC:  Recent Labs Lab 08/11/17 2239  WBC 7.3  NEUTROABS 3.3  HGB 10.1*  HCT 30.5*  MCV 87.4  PLT 290   Basic Metabolic Panel:  Recent Labs Lab 08/11/17 2239  NA 143   K 4.4  CL 115*  CO2 23  GLUCOSE 148*  BUN 16  CREATININE 1.24*  CALCIUM 10.0   GFR: Estimated Creatinine Clearance: 36.4 mL/min (A) (by C-G formula based on SCr of 1.24 mg/dL (H)). Liver Function Tests:  Recent Labs Lab 08/11/17 2239  AST 40  ALT 22  ALKPHOS 115  BILITOT 0.6  PROT 6.4*  ALBUMIN 3.1*   No results for input(s): LIPASE, AMYLASE in the last 168 hours. No results for input(s): AMMONIA in the last 168 hours. Coagulation Profile: No results for input(s): INR, PROTIME in the last 168 hours. Cardiac Enzymes:  Recent Labs Lab 08/11/17 2239  TROPONINI <0.03   BNP (last 3 results) No results for input(s): PROBNP in the last 8760 hours. HbA1C: No results for input(s): HGBA1C in the last 72 hours. CBG:  Recent Labs Lab 08/11/17 2236  GLUCAP 153*   Lipid Profile: No results for input(s): CHOL, HDL, LDLCALC, TRIG, CHOLHDL, LDLDIRECT in the last 72 hours.  Thyroid Function Tests: No results for input(s): TSH, T4TOTAL, FREET4, T3FREE, THYROIDAB in the last 72 hours. Anemia Panel: No results for input(s): VITAMINB12, FOLATE, FERRITIN, TIBC, IRON, RETICCTPCT in the last 72 hours. Urine analysis:    Component Value Date/Time   COLORURINE YELLOW (A) 08/11/2017 2240   APPEARANCEUR CLEAR (A) 08/11/2017 2240   LABSPEC 1.010 08/11/2017 2240   PHURINE 6.0 08/11/2017 2240   GLUCOSEU NEGATIVE 08/11/2017 2240   HGBUR NEGATIVE 08/11/2017 2240   BILIRUBINUR NEGATIVE 08/11/2017 2240   KETONESUR NEGATIVE 08/11/2017 2240   PROTEINUR NEGATIVE 08/11/2017 2240   NITRITE NEGATIVE 08/11/2017 2240   LEUKOCYTESUR NEGATIVE 08/11/2017 2240   Sepsis Labs: @LABRCNTIP (procalcitonin:4,lacticidven:4) )No results found for this or any previous visit (from the past 240 hour(s)).   Radiological Exams on Admission: Dg Chest Portable 1 View  Result Date: 08/11/2017 CLINICAL DATA:  Weakness. EXAM: PORTABLE CHEST 1 VIEW COMPARISON:  05/08/2017 FINDINGS: The cardiomediastinal  contours are unchanged with mild cardiomegaly and tortuous atherosclerotic thoracic aorta. Improved bibasilar aeration from prior exam, minimal residual atelectasis or scarring. No consolidation, pleural effusion, or pneumothorax. The bones are under mineralized. Left thyroid calcification. No acute osseous abnormalities are seen. IMPRESSION: Stable cardiomegaly with tortuous atherosclerotic thoracic aorta. Improved bibasilar aeration with minimal bibasilar atelectasis. Electronically Signed   By: Rubye Oaks M.D.   On: 08/11/2017 23:12    EKG: Normal sinus rhythm at 72 bpm with normal axis and nonspecific ST-T wave changes.   Assessment/Plan  This is a 71 y.o. female with a history of COPD, dementia, hyperlipidemia, vitamin D deficiency now being admitted with:  #. Syncope likely secondary to hypotension, hypovolemia due to diarrhea - Admit observation with telemetry monitoring - IV fluid hydration - Check orthostatics - Trend trops, check TSH, lipids - Consider cardio consult - Consider echo, carotids  # Diarrhea. Likely dark from iron.  FOBT (-) x 2 in ED - Check stool cultures  #. Mild renal insufficiency likely secondary to dehydration -IV fluid hydration Repeat BMP in a.m.  #. Anemia, iron deficiency - chronic and stable Monitor CBC in a.m. Continue iron  #. History of COPD - Continue Pulmicort, Advair, Perforomist, Claritin -O2 and nebs as needed  #. History of gout - Continue colchicine  #. History of dementia - Continue Aricept, Ativan, Remeron, Seroquel  #. History of hyperlipidemia - Continue pravastatin  Admission status: observation, telemetry IV Fluids: normal saline Diet/Nutrition: heart healthy Consults called: none  DVT Px: Lovenox, SCDs and early ambulation. Code Status: Full Code  Disposition Plan: To home in 1-2 days  All the records are reviewed and case discussed with ED provider.  Candice Randall D.O. on 08/12/2017 at 1:06 AM Between  7am to 6pm - Pager - [phone] After 6pm go to [email or web address] - Administrator, [employer] [facility] [place] [facility] Office [phone] CC: Primary care physician; Clovis Pu, Arturo Morton, DO   08/12/2017, 1:06 AM

## 2017-08-13 DIAGNOSIS — R55 Syncope and collapse: Secondary | ICD-10-CM | POA: Diagnosis not present

## 2017-08-13 NOTE — Discharge Summary (Signed)
[facility] - [place] at [facility]   PATIENT NAME: Candice Randall    MR#:  [id number]  DATE OF BIRTH:  1946-11-09  DATE OF ADMISSION:  08/11/2017 ADMITTING PHYSICIAN: Tonye Royalty, DO  DATE OF DISCHARGE: 08/13/17  PRIMARY CARE PHYSICIAN: Clovis Pu, Arturo Morton, DO    ADMISSION DIAGNOSIS:  Hypotension, unspecified hypotension type [I95.9] Syncope, unspecified syncope type [R55]  DISCHARGE DIAGNOSIS:  Hypotension due to GI losses---resolved Acute renal failure due to diarrhea--resolved SECONDARY DIAGNOSIS:   Past Medical History:  Diagnosis Date  . COPD (chronic obstructive pulmonary disease) (HCC)   . Dementia   . Hyperlipidemia   . Vitamin D deficiency     HOSPITAL COURSE:   71 y.o.femalewith a history of COPD, dementia, hyperlipidemia, vitamin D deficiencynow being admitted with:  #. Syncope likely secondary to hypotension, hypovolemia due to diarrhea -received  IV fluid hydration -BP stable -no complaints or issues per RN  # Diarrhea. Likely dark from iron. FOBT (-) x 2 in ED -resolved none since admission  #. Mild renal insufficiency likely secondary to dehydration - received IV fluid hydration Creat back to normal  #. Anemia, iron deficiency - chronic and stable Continue iron  #. History of COPD - Continue Pulmicort, Advair, Perforomist, Claritin -O2 and nebs as needed  #. History of gout - Continue colchicine  #. History of dementia - Continue Aricept, Ativan, Remeron, Seroquel  #. History of hyperlipidemia - Continue pravastatin  D/c to [facility]  today CONSULTS OBTAINED:    DRUG ALLERGIES:  No Known Allergies  DISCHARGE MEDICATIONS:   Current Discharge Medication List    CONTINUE these medications which have NOT CHANGED   Details  acetaminophen (TYLENOL) 500 MG tablet Take 500 mg by mouth 2 (two) times daily.    albuterol (PROVENTIL HFA;VENTOLIN HFA) 108 (90 Base) MCG/ACT inhaler Inhale 1 puff  into the lungs every 6 (six) hours as needed for wheezing or shortness of breath.    albuterol (PROVENTIL) (2.5 MG/3ML) 0.083% nebulizer solution Take 3 mLs (2.5 mg total) by nebulization every 6 (six) hours as needed for wheezing or shortness of breath. Qty: 75 mL, Refills: 12    AMBULATORY NON FORMULARY MEDICATION Medication Name: dispense nebulizer machine Dx:J44.9 Qty: 1 Device, Refills: 0    aspirin EC 81 MG tablet Take 81 mg by mouth daily.    benzonatate (TESSALON) 200 MG capsule Take 1 capsule (200 mg total) by mouth 3 (three) times daily as needed for cough. Qty: 20 capsule, Refills: 0    budesonide (PULMICORT) 0.5 MG/2ML nebulizer solution Take 2 mLs (0.5 mg total) by nebulization 2 (two) times daily. DX.J44.1 Qty: 360 mL, Refills: 5   Associated Diagnoses: Chronic obstructive pulmonary disease, unspecified COPD type (HCC)    cholecalciferol (VITAMIN D) 1000 units tablet Take 1,000 Units by mouth daily.    colchicine 0.6 MG tablet Take 0.6 mg by mouth 2 (two) times daily.    donepezil (ARICEPT) 5 MG tablet Take 5 mg by mouth at bedtime.    ferrous sulfate 325 (65 FE) MG tablet Take 325 mg by mouth daily with breakfast.    folic acid (FOLVITE) 1 MG tablet Take 1 mg by mouth daily.    formoterol (PERFOROMIST) 20 MCG/2ML nebulizer solution Take 2 mLs (20 mcg total) by nebulization 2 (two) times daily. DX:J44.1 Qty: 360 mL, Refills: 5   Associated Diagnoses: Chronic obstructive pulmonary disease, unspecified COPD type (HCC)    loratadine (CLARITIN) 10 MG tablet Take 10 mg  by mouth daily.    LORazepam (ATIVAN) 0.5 MG tablet Take 1 tablet (0.5 mg total) by mouth 2 (two) times daily as needed for anxiety. Qty: 15 tablet, Refills: 0    mirtazapine (REMERON) 7.5 MG tablet Take 7.5 mg by mouth at bedtime.    pravastatin (PRAVACHOL) 10 MG tablet Take 10 mg by mouth at bedtime.    QUEtiapine (SEROQUEL) 50 MG tablet Take 50 mg by mouth 2 (two) times daily.     Fluticasone-Salmeterol (ADVAIR) 250-50 MCG/DOSE AEPB Inhale 1 puff into the lungs 2 (two) times daily.        If you experience worsening of your admission symptoms, develop shortness of breath, life threatening emergency, suicidal or homicidal thoughts you must seek medical attention immediately by calling 911 or calling your MD immediately  if symptoms less severe.  You Must read complete instructions/literature along with all the possible adverse reactions/side effects for all the Medicines you take and that have been prescribed to you. Take any new Medicines after you have completely understood and accept all the possible adverse reactions/side effects.   Please note  You were cared for by a hospitalist during your hospital stay. If you have any questions about your discharge medications or the care you received while you were in the hospital after you are discharged, you can call the unit and asked to speak with the hospitalist on call if the hospitalist that took care of you is not available. Once you are discharged, your primary care physician will handle any further medical issues. Please note that NO REFILLS for any discharge medications will be authorized once you are discharged, as it is imperative that you return to your primary care physician (or establish a relationship with a primary care physician if you do not have one) for your aftercare needs so that they can reassess your need for medications and monitor your lab values. Today   SUBJECTIVE   Confused but pleasant. Eating BF  VITAL SIGNS:  Blood pressure 122/70, pulse 61, temperature 98.1 F (36.7 C), temperature source Oral, resp. rate 19, height 5\' 5"  (1.651 m), weight 55.3 kg (122 lb), SpO2 100 %.  I/O:   Intake/Output Summary (Last 24 hours) at 08/13/17 0946 Last data filed at 08/13/17 0222  Gross per 24 hour  Intake              465 ml  Output              400 ml  Net               65 ml    PHYSICAL  EXAMINATION:  GENERAL:  71 y.o.-year-old patient lying in the bed with no acute distress.  EYES: Pupils equal, round, reactive to light and accommodation. No scleral icterus. Extraocular muscles intact.  HEENT: Head atraumatic, normocephalic. Oropharynx and nasopharynx clear.  NECK:  Supple, no jugular venous distention. No thyroid enlargement, no tenderness.  LUNGS: Normal breath sounds bilaterally, no wheezing, rales,rhonchi or crepitation. No use of accessory muscles of respiration.  CARDIOVASCULAR: S1, S2 normal. No murmurs, rubs, or gallops.  ABDOMEN: Soft, non-tender, non-distended. Bowel sounds present. No organomegaly or mass.  EXTREMITIES: No pedal edema, cyanosis, or clubbing.  NEUROLOGIC: Cranial nerves II through XII are intact. Muscle strength 5/5 in all extremities. Sensation intact. Gait not checked.  PSYCHIATRIC: The patient is alert  And mild confusion SKIN: No obvious rash, lesion, or ulcer.   DATA REVIEW:   CBC   Recent Labs  Lab 08/12/17 0439  WBC 7.2  HGB 10.6*  HCT 31.4*  PLT 280    Chemistries   Recent Labs Lab 08/11/17 2239 08/12/17 0439  NA 143 143  K 4.4 4.2  CL 115* 115*  CO2 23 23  GLUCOSE 148* 93  BUN 16 15  CREATININE 1.24* 0.99  CALCIUM 10.0 10.4*  AST 40  --   ALT 22  --   ALKPHOS 115  --   BILITOT 0.6  --     Microbiology Results   Recent Results (from the past 240 hour(s))  MRSA PCR Screening     Status: None   Collection Time: 08/12/17  4:20 AM  Result Value Ref Range Status   MRSA by PCR NEGATIVE NEGATIVE Final    Comment:        The GeneXpert MRSA Assay (FDA approved for NASAL specimens only), is one component of a comprehensive MRSA colonization surveillance program. It is not intended to diagnose MRSA infection nor to guide or monitor treatment for MRSA infections.     RADIOLOGY:  Dg Chest Portable 1 View  Result Date: 08/11/2017 CLINICAL DATA:  Weakness. EXAM: PORTABLE CHEST 1 VIEW COMPARISON:  05/08/2017  FINDINGS: The cardiomediastinal contours are unchanged with mild cardiomegaly and tortuous atherosclerotic thoracic aorta. Improved bibasilar aeration from prior exam, minimal residual atelectasis or scarring. No consolidation, pleural effusion, or pneumothorax. The bones are under mineralized. Left thyroid calcification. No acute osseous abnormalities are seen. IMPRESSION: Stable cardiomegaly with tortuous atherosclerotic thoracic aorta. Improved bibasilar aeration with minimal bibasilar atelectasis. Electronically Signed   By: Rubye Oaks M.D.   On: 08/11/2017 23:12     Management plans discussed with the patient, family and they are in agreement.  CODE STATUS:     Code Status Orders        Start     Ordered   08/12/17 0233  Full code  Continuous     08/12/17 0232    Code Status History    Date Active Date Inactive Code Status Order ID Comments User Context   03/25/2017 11:43 PM 03/29/2017  7:57 PM Full Code [id number]  Oralia Manis, MD Inpatient   07/28/2016  2:11 PM 07/29/2016  5:17 PM Full Code [id number]  Milagros Loll, MD ED      TOTAL TIME TAKING CARE OF THIS PATIENT: 40 minutes.    Alechia Lezama M.D on 08/13/2017 at 9:46 AM  Between 7am to 6pm - Pager - [phone] After 6pm go to [email or web address] - password [email or web address] [email or web address]  [facility]  Office  [phone]  CC: Primary care physician; Clovis Pu, Arturo Morton, DO

## 2017-08-13 NOTE — Evaluation (Signed)
Physical Therapy Evaluation Patient Details Name: Candice Randall MRN: [id number] DOB: 07/27/1946 Today's Date: 08/13/2017   History of Present Illness  Patient is a 71 y/o female that presents with a syncopal episode, found to have low BP, black diahrrea. Hemoccult was negative x2. She has a history of dementia.   Clinical Impression  Patient demonstrates very poor mentation, requires constant facilitation from therapist to follow commands, re-direct her movements. She is able to ambulate around the RN station, though with constant PT facilitation of her direction/RW placement. She can physically participate in rehab services, but do not expect any carryover cognitively as she demonstrates poor short term memory and command following skills. This appears to be her physical baseline.     Follow Up Recommendations Home health PT    Equipment Recommendations  Rolling walker with 5" wheels    Recommendations for Other Services       Precautions / Restrictions Precautions Precautions: Fall Restrictions Weight Bearing Restrictions: No      Mobility  Bed Mobility               General bed mobility comments: Patient sitting at EOB when therapist arrived.   Transfers Overall transfer level: Needs assistance Equipment used: Rolling walker (2 wheeled) Transfers: Sit to/from Stand Sit to Stand: Min guard;Min assist         General transfer comment: Patient required therapist to hold out her hands and get her started in transfer as she was not following any commands, once transfer started she was able to provide the remainder of the effort.   Ambulation/Gait Ambulation/Gait assistance: Min guard Ambulation Distance (Feet): 200 Feet Assistive device: Rolling walker (2 wheeled) Gait Pattern/deviations: WFL(Within Functional Limits)   Gait velocity interpretation: <1.8 ft/sec, indicative of risk for recurrent falls General Gait Details: Patient drifts laterally in gait due to  head turns to look around. No overt loss of balance but patient required constant guiding from therapist to navigate safely through the hallway.   Stairs            Wheelchair Mobility    Modified Rankin (Stroke Patients Only)       Balance Overall balance assessment: Needs assistance Sitting-balance support: No upper extremity supported Sitting balance-Leahy Scale: Good     Standing balance support: Bilateral upper extremity supported Standing balance-Leahy Scale: Fair                               Pertinent Vitals/Pain Pain Assessment: No/denies pain    Home Living Family/patient expects to be discharged to:: Skilled nursing facility                      Prior Function           Comments: Patient is unable to provide any history. It appears she has been ambulatory with RW.      Hand Dominance        Extremity/Trunk Assessment   Upper Extremity Assessment Upper Extremity Assessment: Overall WFL for tasks assessed    Lower Extremity Assessment Lower Extremity Assessment: Overall WFL for tasks assessed       Communication   Communication: Receptive difficulties;Expressive difficulties  Cognition Arousal/Alertness: Awake/alert Behavior During Therapy: Impulsive Overall Cognitive Status: History of cognitive impairments - at baseline  General Comments: No family present, however patient is unable to follow commands ~50% of the time, not oriented to where she is. She is oriented to self.       General Comments      Exercises     Assessment/Plan    PT Assessment Patient needs continued PT services  PT Problem List Decreased strength;Decreased activity tolerance;Decreased balance;Decreased knowledge of use of DME;Decreased cognition       PT Treatment Interventions DME instruction;Gait training;Therapeutic exercise;Therapeutic activities;Balance training;Stair  training;Neuromuscular re-education    PT Goals (Current goals can be found in the Care Plan section)  Acute Rehab PT Goals PT Goal Formulation: Patient unable to participate in goal setting Time For Goal Achievement: 08/27/17 Potential to Achieve Goals: Fair    Frequency Min 2X/week   Barriers to discharge        Co-evaluation               AM-PAC PT "6 Clicks" Daily Activity  Outcome Measure Difficulty turning over in bed (including adjusting bedclothes, sheets and blankets)?: A Little Difficulty moving from lying on back to sitting on the side of the bed? : A Little Difficulty sitting down on and standing up from a chair with arms (e.g., wheelchair, bedside commode, etc,.)?: A Little Help needed moving to and from a bed to chair (including a wheelchair)?: None Help needed walking in hospital room?: None Help needed climbing 3-5 steps with a railing? : A Little 6 Click Score: 20    End of Session Equipment Utilized During Treatment: Gait belt Activity Tolerance: Patient tolerated treatment well Patient left: in chair;with chair alarm set;with call bell/phone within reach Nurse Communication: Mobility status PT Visit Diagnosis: Difficulty in walking, not elsewhere classified (R26.2)    Time: 5537-4827 PT Time Calculation (min) (ACUTE ONLY): 15 min   Charges:   PT Evaluation $PT Eval Moderate Complexity: 1 Mod     PT G Codes:   PT G-Codes **NOT FOR INPATIENT CLASS** Functional Assessment Tool Used: AM-PAC 6 Clicks Basic Mobility Functional Limitation: Mobility: Walking and moving around Mobility: Walking and Moving Around Current Status (M7867): At least 1 percent but less than 20 percent impaired, limited or restricted Mobility: Walking and Moving Around Goal Status [id number]): At least 1 percent but less than 20 percent impaired, limited or restricted   Alva Garnet PT, DPT, CSCS    08/13/2017, 12:00 PM

## 2017-08-13 NOTE — Discharge Instructions (Signed)
Hypotension As your heart beats, it forces blood through your body. This force is called blood pressure. If you have hypotension, you have low blood pressure. When your blood pressure is too low, you may not get enough blood to your brain. You may feel weak, feel light-headed, have a fast heartbeat, or even pass out (faint). Follow these instructions at home: Eating and drinking  Drink enough fluids to keep your pee (urine) clear or pale yellow.  Eat a healthy diet, and follow instructions from your doctor about eating or drinking restrictions. A healthy diet includes: ? Fresh fruits and vegetables. ? Whole grains. ? Low-fat (lean) meats. ? Low-fat dairy products.  Eat extra salt only as told. Do not add extra salt to your diet unless your doctor tells you to.  Eat small meals often.  Avoid standing up quickly after you eat. Medicines  Take over-the-counter and prescription medicines only as told by your doctor. ? Follow instructions from your doctor about changing how much you take (the dosage) of your medicines, if this applies. ? Do not stop or change your medicine on your own. General instructions  Wear compression stockings as told by your doctor.  Get up slowly from lying down or sitting.  Avoid hot showers and a lot of heat as told by your doctor.  Return to your normal activities as told by your doctor. Ask what activities are safe for you.  Do not use any products that contain nicotine or tobacco, such as cigarettes and e-cigarettes. If you need help quitting, ask your doctor.  Keep all follow-up visits as told by your doctor. This is important. Contact a doctor if:  You throw up (vomit).  You have watery poop (diarrhea).  You have a fever for more than 2-3 days.  You feel more thirsty than normal.  You feel weak and tired. Get help right away if:  You have chest pain.  You have a fast or irregular heartbeat.  You lose feeling (get numbness) in any part  of your body.  You cannot move your arms or your legs.  You have trouble talking.  You get sweaty or feel light-headed.  You faint.  You have trouble breathing.  You have trouble staying awake.  You feel confused. This information is not intended to replace advice given to you by your health care provider. Make sure you discuss any questions you have with your health care provider. Document Released: 03/08/2010 Document Revised: 08/30/2016 Document Reviewed: 08/30/2016 Elsevier Interactive Patient Education  2017 [employer]   

## 2017-08-13 NOTE — Progress Notes (Signed)
Called report to facility nurse at this time. Patient's brother to transport to facility. He's expected to arrive in about 3 minutes. [facility]

## 2017-09-12 ENCOUNTER — Ambulatory Visit: Payer: Medicare Other | Admitting: Internal Medicine

## 2017-09-13 ENCOUNTER — Telehealth: Payer: Self-pay | Admitting: *Deleted

## 2017-09-13 NOTE — Telephone Encounter (Signed)
Received referral for initial lung cancer screening scan. Contacted POA due to patient's documented dementia. POA verbalizes being overwhelmed with upcoming appointments for Candice Randall and requests I contact her in January to discuss screening at that time. POA is aware of lung screening importance and potential risks of delaying screening. She also has my contact information if she desires screening sooner.

## 2017-09-14 NOTE — Telephone Encounter (Signed)
FYI

## 2017-09-24 ENCOUNTER — Emergency Department
Admission: EM | Admit: 2017-09-24 | Discharge: 2017-09-24 | Disposition: A | Discharge status: Skilled Nursing Facility | Payer: Medicare Other | Attending: Emergency Medicine | Admitting: Emergency Medicine

## 2017-09-24 ENCOUNTER — Emergency Department: Payer: Medicare Other

## 2017-09-24 ENCOUNTER — Encounter: Payer: Self-pay | Admitting: Emergency Medicine

## 2017-09-24 DIAGNOSIS — F0390 Unspecified dementia without behavioral disturbance: Secondary | ICD-10-CM | POA: Diagnosis not present

## 2017-09-24 DIAGNOSIS — Z7982 Long term (current) use of aspirin: Secondary | ICD-10-CM | POA: Diagnosis not present

## 2017-09-24 DIAGNOSIS — Z79899 Other long term (current) drug therapy: Secondary | ICD-10-CM | POA: Diagnosis not present

## 2017-09-24 DIAGNOSIS — F17200 Nicotine dependence, unspecified, uncomplicated: Secondary | ICD-10-CM | POA: Diagnosis not present

## 2017-09-24 DIAGNOSIS — J449 Chronic obstructive pulmonary disease, unspecified: Secondary | ICD-10-CM | POA: Insufficient documentation

## 2017-09-24 DIAGNOSIS — N3090 Cystitis, unspecified without hematuria: Secondary | ICD-10-CM | POA: Diagnosis not present

## 2017-09-24 DIAGNOSIS — M25552 Pain in left hip: Secondary | ICD-10-CM | POA: Insufficient documentation

## 2017-09-24 DIAGNOSIS — N3000 Acute cystitis without hematuria: Secondary | ICD-10-CM

## 2017-09-24 DIAGNOSIS — W19XXXA Unspecified fall, initial encounter: Secondary | ICD-10-CM

## 2017-09-24 LAB — URINALYSIS, COMPLETE (UACMP) WITH MICROSCOPIC
Bilirubin Urine: NEGATIVE
GLUCOSE, UA: NEGATIVE mg/dL
HGB URINE DIPSTICK: NEGATIVE
KETONES UR: NEGATIVE mg/dL
NITRITE: NEGATIVE
PROTEIN: NEGATIVE mg/dL
Specific Gravity, Urine: 1.012 (ref 1.005–1.030)
Squamous Epithelial / LPF: NONE SEEN
pH: 6 (ref 5.0–8.0)

## 2017-09-24 LAB — BASIC METABOLIC PANEL
Anion gap: 8 (ref 5–15)
BUN: 17 mg/dL (ref 6–20)
CHLORIDE: 106 mmol/L (ref 101–111)
CO2: 25 mmol/L (ref 22–32)
Calcium: 11 mg/dL — ABNORMAL HIGH (ref 8.9–10.3)
Creatinine, Ser: 1.15 mg/dL — ABNORMAL HIGH (ref 0.44–1.00)
GFR calc non Af Amer: 46 mL/min — ABNORMAL LOW (ref >60)
GFR, EST AFRICAN AMERICAN: 53 mL/min — AB (ref >60)
Glucose, Bld: 91 mg/dL (ref 65–99)
POTASSIUM: 3.6 mmol/L (ref 3.5–5.1)
SODIUM: 139 mmol/L (ref 135–145)

## 2017-09-24 LAB — CBC
HEMATOCRIT: 33.9 % — AB (ref 35.0–47.0)
HEMOGLOBIN: 11.6 g/dL — AB (ref 12.0–16.0)
MCH: 30.1 pg (ref 26.0–34.0)
MCHC: 34.3 g/dL (ref 32.0–36.0)
MCV: 87.7 fL (ref 80.0–100.0)
Platelets: 280 10*3/uL (ref 150–440)
RBC: 3.87 MIL/uL (ref 3.80–5.20)
RDW: 15 % — ABNORMAL HIGH (ref 11.5–14.5)
WBC: 9.8 10*3/uL (ref 3.6–11.0)

## 2017-09-24 MED ORDER — CEFTRIAXONE SODIUM 1 G IJ SOLR
INTRAMUSCULAR | Status: AC
  Administered 2017-09-24: 1 g via INTRAMUSCULAR
  Filled 2017-09-24: qty 10

## 2017-09-24 MED ORDER — CEFTRIAXONE SODIUM 1 G IJ SOLR
1.0000 g | Freq: Once | INTRAMUSCULAR | Status: AC
  Administered 2017-09-24: 1 g via INTRAMUSCULAR

## 2017-09-24 MED ORDER — CEPHALEXIN 250 MG PO CAPS: 250.0000 mg | ORAL_CAPSULE | Freq: Three times a day (TID) | ORAL | 0 refills | Status: AC

## 2017-09-24 MED ORDER — CEFTRIAXONE SODIUM IN DEXTROSE 20 MG/ML IV SOLN
1.0000 g | Freq: Once | INTRAVENOUS | Status: DC
Start: 2017-09-24 — End: 2017-09-24

## 2017-09-24 NOTE — ED Notes (Signed)
Pt sleeping. 

## 2017-09-24 NOTE — ED Notes (Signed)
Talked to [facility] about plan for pt

## 2017-09-24 NOTE — ED Notes (Signed)
Fall mats x3 placed around pt's bed, bed in low position, bed alarm on pt and turned on, yellow nonskid socks on feet and fall risk band on right wrist. Additional warm blankets provide for comfort. siderails up x2.

## 2017-09-24 NOTE — ED Notes (Signed)
Report to felicia, rn.  

## 2017-09-24 NOTE — ED Notes (Signed)
Pt can bear weight on both legs, will not comply with walking.

## 2017-09-24 NOTE — ED Notes (Signed)
Bed alarm placed on pt.

## 2017-09-24 NOTE — ED Provider Notes (Signed)
-----------------------------------------   9:55 AM on 09/24/2017 -----------------------------------------  patient's workup shows a urinary tract infection. CT scan of head is negative. We will discharge the patient with antibiotics. Patient will return to her nursing facility.   Minna Antis, MD 09/24/17 [phone]

## 2017-09-24 NOTE — ED Provider Notes (Signed)
[facility] Emergency Department Provider Note   ____________________________________________   First MD Initiated Contact with Patient 09/24/17 [phone]     (approximate)  I have reviewed the triage vital signs and the nursing notes.   HISTORY  Chief Complaint Fall  patient with a history of dementia and unable to participate in history.  HPI Candice Randall is a 71 y.o. female who was brought into the hospital today by EMS. The patient is from a nursing facility and had an unwitnessed fall. She was found by staff seated on the floor. They told the patient to stay put but she got up and sat herself on the bed. According to EMS staff at the facility noted that the patient was having some left hip pain so sent her here for evaluation. It is unclear if she hit her head at all. The patient has a history of dementia so she does not know exactly what happened. EMS said that when the examine her hip she didn't seem to be in pain but she would not put full weight on that left hip. She is at her mental baseline according to the nursing home staff. She was sent here for evaluation.   Past Medical History:  Diagnosis Date  . COPD (chronic obstructive pulmonary disease) (HCC)   . Dementia   . Hyperlipidemia   . Vitamin D deficiency     Patient Active Problem List   Diagnosis Date Noted  . Syncope and collapse 08/12/2017  . SOB (shortness of breath) 05/09/2017  . Syncope 05/09/2017  . Sepsis (HCC) 03/25/2017  . HCAP (healthcare-associated pneumonia) 03/25/2017  . Dementia 03/25/2017  . HLD (hyperlipidemia) 03/25/2017  . Dehydration 07/28/2016    Past Surgical History:  Procedure Laterality Date  . NO PAST SURGERIES      Prior to Admission medications   Medication Sig Start Date End Date Taking? Authorizing Provider  acetaminophen (TYLENOL) 500 MG tablet Take 500 mg by mouth 2 (two) times daily.    [provider]  albuterol (PROVENTIL HFA;VENTOLIN  HFA) 108 (90 Base) MCG/ACT inhaler Inhale 1 puff into the lungs every 6 (six) hours as needed for wheezing or shortness of breath.    [provider]  albuterol (PROVENTIL) (2.5 MG/3ML) 0.083% nebulizer solution Take 3 mLs (2.5 mg total) by nebulization every 6 (six) hours as needed for wheezing or shortness of breath. 03/29/17   Ramonita Lab, MD  AMBULATORY NON FORMULARY MEDICATION Medication Name: dispense nebulizer machine Dx:J44.9 06/15/17   Erin Fulling, MD  aspirin EC 81 MG tablet Take 81 mg by mouth daily.    [provider]  benzonatate (TESSALON) 200 MG capsule Take 1 capsule (200 mg total) by mouth 3 (three) times daily as needed for cough. 03/29/17   Gouru, Deanna Artis, MD  budesonide (PULMICORT) 0.5 MG/2ML nebulizer solution Take 2 mLs (0.5 mg total) by nebulization 2 (two) times daily. DX.J44.1 06/08/17 06/08/18  Erin Fulling, MD  cholecalciferol (VITAMIN D) 1000 units tablet Take 1,000 Units by mouth daily.    [provider]  colchicine 0.6 MG tablet Take 0.6 mg by mouth 2 (two) times daily.    [provider]  donepezil (ARICEPT) 5 MG tablet Take 5 mg by mouth at bedtime.    [provider]  ferrous sulfate 325 (65 FE) MG tablet Take 325 mg by mouth daily with breakfast.    [provider]  Fluticasone-Salmeterol (ADVAIR) 250-50 MCG/DOSE AEPB Inhale 1 puff into the lungs 2 (two) times  daily.    [provider]  folic acid (FOLVITE) 1 MG tablet Take 1 mg by mouth daily.    [provider]  formoterol (PERFOROMIST) 20 MCG/2ML nebulizer solution Take 2 mLs (20 mcg total) by nebulization 2 (two) times daily. DX:J44.1 06/08/17   Erin Fulling, MD  loratadine (CLARITIN) 10 MG tablet Take 10 mg by mouth daily.    [provider]  LORazepam (ATIVAN) 0.5 MG tablet Take 1 tablet (0.5 mg total) by mouth 2 (two) times daily as needed for anxiety. 03/29/17   Gouru, Deanna Artis, MD  mirtazapine (REMERON) 7.5 MG tablet Take 7.5 mg by mouth at  bedtime.    [provider]  pravastatin (PRAVACHOL) 10 MG tablet Take 10 mg by mouth at bedtime.    [provider]  QUEtiapine (SEROQUEL) 50 MG tablet Take 50 mg by mouth 2 (two) times daily.    [provider]    Allergies Patient has no known allergies.  Family History  Problem Relation Age of Onset  . Family history unknown: Yes    Social History Social History  Substance Use Topics  . Smoking status: Current Some Day Smoker  . Smokeless tobacco: Never Used  . Alcohol use No    Review of Systems  unable to assess due to patient dementia  ____________________________________________   PHYSICAL EXAM:  VITAL SIGNS: ED Triage Vitals [09/24/17 0427]  Enc Vitals Group     BP 138/88     Pulse Rate 74     Resp 16     Temp 98 F (36.7 C)     Temp Source Oral     SpO2 100 %     Weight 140 lb (63.5 kg)     Height      Head Circumference      Peak Flow      Pain Score      Pain Loc      Pain Edu?      Excl. in GC?     Constitutional: Alert and oriented. Well appearing and in no acute distress. Eyes: Conjunctivae are normal. PERRL. EOMI. Head: Atraumatic. Nose: No congestion/rhinnorhea. Mouth/Throat: Mucous membranes are moist.  Oropharynx non-erythematous. Cardiovascular: Normal rate, regular rhythm. Grossly normal heart sounds.  Good peripheral circulation. Respiratory: Normal respiratory effort.  No retractions. Lungs CTAB. Gastrointestinal: Soft and nontender. No distention.  Musculoskeletal: No lower extremity tenderness nor edema. left hip with some mild tenderness to palpation and internally rotated Neurologic:  Normal speech and language.  Skin:  Skin is warm, dry and intact.  Psychiatric: Mood and affect are normal.   ____________________________________________   LABS (all labs ordered are listed, but only abnormal results are displayed)  Labs Reviewed  CBC - Abnormal; Notable for the following:       Result Value    Hemoglobin 11.6 (*)    HCT 33.9 (*)    RDW 15.0 (*)    All other components within normal limits  BASIC METABOLIC PANEL - Abnormal; Notable for the following:    Creatinine, Ser 1.15 (*)    Calcium 11.0 (*)    GFR calc non Af Amer 46 (*)    GFR calc Af Amer 53 (*)    All other components within normal limits  URINALYSIS, COMPLETE (UACMP) WITH MICROSCOPIC - Abnormal; Notable for the following:    Color, Urine YELLOW (*)    APPearance HAZY (*)    Leukocytes, UA SMALL (*)    Bacteria, UA MANY (*)  All other components within normal limits   ____________________________________________  EKG  none ____________________________________________  RADIOLOGY  Ct Head Wo Contrast  Result Date: 09/24/2017 CLINICAL DATA:  Dementia patient post unwitnessed fall. EXAM: CT HEAD WITHOUT CONTRAST CT CERVICAL SPINE WITHOUT CONTRAST TECHNIQUE: Multidetector CT imaging of the head and cervical spine was performed following the standard protocol without intravenous contrast. Multiplanar CT image reconstructions of the cervical spine were also generated. COMPARISON:  Head CT 05/08/2017. Head and cervical spine CT 06/26/2016 FINDINGS: CT HEAD FINDINGS Brain: Stable generalized atrophy. Stable chronic small vessel ischemia. No intracranial hemorrhage, mass effect, or midline shift. No hydrocephalus. The basilar cisterns are patent. No evidence of territorial infarct or acute ischemia. No extra-axial or intracranial fluid collection. Vascular: Atherosclerosis of skullbase vasculature without hyperdense vessel or abnormal calcification. Skull: No fracture or focal lesion. Sinuses/Orbits: Paranasal sinuses and mastoid air cells are clear. The visualized orbits are unremarkable. Other: None. CT CERVICAL SPINE FINDINGS Alignment: Moderate dextroscoliotic curvature of the lower cervical spine. Degenerative type anterolisthesis of C5 on C6. Skull base and vertebrae: Absent left posterior arch of C1, congenital. No  acute fracture. Dens and skullbase are intact. Mild chronic wedging of C6 vertebral body. Soft tissues and spinal canal: No prevertebral fluid or swelling. No visible canal hematoma. Disc levels: Diffuse disc space narrowing and endplate spurring. Multilevel facet arthropathy. Degenerative changes are stable from prior exam. Upper chest: No acute abnormality. Right thyroid nodule is unchanged per Other: Carotid calcifications. IMPRESSION: 1. No acute intracranial abnormality. No skull fracture. Stable atrophy and chronic small vessel ischemia. 2. Multilevel degenerative change throughout cervical spine without acute fracture or subluxation. 3. Carotid and skullbase atherosclerosis. Electronically Signed   By: Rubye Oaks M.D.   On: 09/24/2017 05:53   Ct Cervical Spine Wo Contrast  Result Date: 09/24/2017 CLINICAL DATA:  Dementia patient post unwitnessed fall. EXAM: CT HEAD WITHOUT CONTRAST CT CERVICAL SPINE WITHOUT CONTRAST TECHNIQUE: Multidetector CT imaging of the head and cervical spine was performed following the standard protocol without intravenous contrast. Multiplanar CT image reconstructions of the cervical spine were also generated. COMPARISON:  Head CT 05/08/2017. Head and cervical spine CT 06/26/2016 FINDINGS: CT HEAD FINDINGS Brain: Stable generalized atrophy. Stable chronic small vessel ischemia. No intracranial hemorrhage, mass effect, or midline shift. No hydrocephalus. The basilar cisterns are patent. No evidence of territorial infarct or acute ischemia. No extra-axial or intracranial fluid collection. Vascular: Atherosclerosis of skullbase vasculature without hyperdense vessel or abnormal calcification. Skull: No fracture or focal lesion. Sinuses/Orbits: Paranasal sinuses and mastoid air cells are clear. The visualized orbits are unremarkable. Other: None. CT CERVICAL SPINE FINDINGS Alignment: Moderate dextroscoliotic curvature of the lower cervical spine. Degenerative type anterolisthesis  of C5 on C6. Skull base and vertebrae: Absent left posterior arch of C1, congenital. No acute fracture. Dens and skullbase are intact. Mild chronic wedging of C6 vertebral body. Soft tissues and spinal canal: No prevertebral fluid or swelling. No visible canal hematoma. Disc levels: Diffuse disc space narrowing and endplate spurring. Multilevel facet arthropathy. Degenerative changes are stable from prior exam. Upper chest: No acute abnormality. Right thyroid nodule is unchanged per Other: Carotid calcifications. IMPRESSION: 1. No acute intracranial abnormality. No skull fracture. Stable atrophy and chronic small vessel ischemia. 2. Multilevel degenerative change throughout cervical spine without acute fracture or subluxation. 3. Carotid and skullbase atherosclerosis. Electronically Signed   By: Rubye Oaks M.D.   On: 09/24/2017 05:53   Dg Hip Unilat W Or Wo Pelvis 2-3 Views Left  Result  Date: 09/24/2017 CLINICAL DATA:  71 y/o  F; fall with left hip pain. EXAM: DG HIP (WITH OR WITHOUT PELVIS) 2-3V LEFT COMPARISON:  None. FINDINGS: There is no evidence of hip fracture or dislocation. Mild osteoarthrosis of the hip joints with superior joint space narrowing and periarticular osteophytes. Vascular calcifications. IMPRESSION: 1. No acute fracture identified. 2. Mild osteoarthrosis of the hip joints. Electronically Signed   By: Mitzi Hansen M.D.   On: 09/24/2017 05:32    ____________________________________________   PROCEDURES  Procedure(s) performed: None  Procedures  Critical Care performed: No  ____________________________________________   INITIAL IMPRESSION / ASSESSMENT AND PLAN / ED COURSE  Pertinent labs & imaging results that were available during my care of the patient were reviewed by me and considered in my medical decision making (see chart for details).  this is a 71 year old female with a history of dementia who comes into the hospital today after an unwitnessed  fall.  My differential diagnosis includes left hip fracture, musculoskeletal strain, head injury.  I did check some basic blood work as the patient could not tell me much of a history. I also ordered a CT scan of the patient's head and cervical spine as well as an x-ray of her left hip. The patient's lab work looks unremarkable and her imaging studies are negative. We will have the patient stand and if she is able to bear weight on her left hip we will discharge her to her nursing facility. If not we will send the patient for a CT scan of her hip looking for any other occult injury.     They did attempt to ambulate the patient and she would not walk. I will send the patient for a CT of her left hip. The patient did receive a dose of ceftriaxone for urinary tract infection. Dr. Lenard Lance will await the results and reassess the patient.  ____________________________________________   FINAL CLINICAL IMPRESSION(S) / ED DIAGNOSES  Final diagnoses:  Fall, initial encounter  Left hip pain  Acute cystitis without hematuria      NEW MEDICATIONS STARTED DURING THIS VISIT:  New Prescriptions   No medications on file     Note:  This document was prepared using Dragon voice recognition software and may include unintentional dictation errors.    Rebecka Apley, MD 09/24/17 [phone]

## 2017-09-24 NOTE — ED Triage Notes (Signed)
Pt from [facility] with unwitnessed fall. Pt complains of intermittent left hip pain. Pt with history of dementia, unable to recall events leading to fall.

## 2017-09-24 NOTE — ED Notes (Signed)
Pt placed in recliner, moved near nurses station. Given newspaper and coffee.

## 2017-09-27 ENCOUNTER — Emergency Department: Payer: Medicare Other

## 2017-09-27 ENCOUNTER — Emergency Department
Admission: EM | Admit: 2017-09-27 | Discharge: 2017-09-27 | Disposition: A | Discharge status: Home / Self Care | Payer: Medicare Other | Attending: Emergency Medicine | Admitting: Emergency Medicine

## 2017-09-27 ENCOUNTER — Encounter: Payer: Self-pay | Admitting: Intensive Care

## 2017-09-27 DIAGNOSIS — F17200 Nicotine dependence, unspecified, uncomplicated: Secondary | ICD-10-CM | POA: Diagnosis not present

## 2017-09-27 DIAGNOSIS — F0390 Unspecified dementia without behavioral disturbance: Secondary | ICD-10-CM | POA: Insufficient documentation

## 2017-09-27 DIAGNOSIS — R4182 Altered mental status, unspecified: Secondary | ICD-10-CM | POA: Diagnosis present

## 2017-09-27 DIAGNOSIS — J449 Chronic obstructive pulmonary disease, unspecified: Secondary | ICD-10-CM | POA: Diagnosis not present

## 2017-09-27 LAB — CBC WITH DIFFERENTIAL/PLATELET
BASOS ABS: 0 10*3/uL (ref 0–0.1)
BASOS PCT: 0 %
Eosinophils Absolute: 0.1 10*3/uL (ref 0–0.7)
Eosinophils Relative: 2 %
HEMATOCRIT: 32 % — AB (ref 35.0–47.0)
Hemoglobin: 10.9 g/dL — ABNORMAL LOW (ref 12.0–16.0)
Lymphocytes Relative: 26 %
Lymphs Abs: 2.3 10*3/uL (ref 1.0–3.6)
MCH: 29.2 pg (ref 26.0–34.0)
MCHC: 34 g/dL (ref 32.0–36.0)
MCV: 85.7 fL (ref 80.0–100.0)
MONO ABS: 0.9 10*3/uL (ref 0.2–0.9)
MONOS PCT: 10 %
NEUTROS ABS: 5.5 10*3/uL (ref 1.4–6.5)
Neutrophils Relative %: 62 %
PLATELETS: 323 10*3/uL (ref 150–440)
RBC: 3.74 MIL/uL — ABNORMAL LOW (ref 3.80–5.20)
RDW: 14.8 % — AB (ref 11.5–14.5)
WBC: 8.8 10*3/uL (ref 3.6–11.0)

## 2017-09-27 LAB — COMPREHENSIVE METABOLIC PANEL
ALBUMIN: 3.4 g/dL — AB (ref 3.5–5.0)
ALT: 14 U/L (ref 14–54)
ANION GAP: 7 (ref 5–15)
AST: 35 U/L (ref 15–41)
Alkaline Phosphatase: 181 U/L — ABNORMAL HIGH (ref 38–126)
BILIRUBIN TOTAL: 1.1 mg/dL (ref 0.3–1.2)
BUN: 20 mg/dL (ref 6–20)
CHLORIDE: 108 mmol/L (ref 101–111)
CO2: 25 mmol/L (ref 22–32)
Calcium: 11 mg/dL — ABNORMAL HIGH (ref 8.9–10.3)
Creatinine, Ser: 1.71 mg/dL — ABNORMAL HIGH (ref 0.44–1.00)
GFR calc Af Amer: 33 mL/min — ABNORMAL LOW (ref >60)
GFR calc non Af Amer: 28 mL/min — ABNORMAL LOW (ref >60)
GLUCOSE: 91 mg/dL (ref 65–99)
POTASSIUM: 4.6 mmol/L (ref 3.5–5.1)
SODIUM: 140 mmol/L (ref 135–145)
TOTAL PROTEIN: 7.7 g/dL (ref 6.5–8.1)

## 2017-09-27 LAB — URINALYSIS, COMPLETE (UACMP) WITH MICROSCOPIC
Bilirubin Urine: NEGATIVE
GLUCOSE, UA: NEGATIVE mg/dL
HGB URINE DIPSTICK: NEGATIVE
Ketones, ur: NEGATIVE mg/dL
LEUKOCYTES UA: NEGATIVE
NITRITE: NEGATIVE
Protein, ur: 30 mg/dL — AB
SPECIFIC GRAVITY, URINE: 1.017 (ref 1.005–1.030)
pH: 5 (ref 5.0–8.0)

## 2017-09-27 LAB — TROPONIN I: Troponin I: <0.03 ng/mL (ref <0.03)

## 2017-09-27 LAB — GLUCOSE, CAPILLARY: GLUCOSE-CAPILLARY: 147 mg/dL — AB (ref 65–99)

## 2017-09-27 MED ORDER — SODIUM CHLORIDE 0.9 % IV SOLN
Freq: Once | INTRAVENOUS | Status: AC
  Administered 2017-09-27: 14:00:00 via INTRAVENOUS

## 2017-09-27 NOTE — ED Provider Notes (Signed)
[facility] Emergency Department Provider Note       Time seen: ----------------------------------------- 12:23 PM on 09/27/2017 -----------------------------------------  Level V caveat: History/ROS limited by dementia   I have reviewed the triage vital signs and the nursing notes.   HISTORY   Chief Complaint Near Syncope    HPI Candice Randall is a 71 y.o. female who presents to the ED for altered mental status. Patient arrives by EMS from [facility] with concerns by the staff that it that she was having a stroke. Staff had taken the patient outside and walk around when he came inside and reported she became sweaty and was talking in a whispering voice. She does have history of dementia and COPD  Past Medical History:  Diagnosis Date  . COPD (chronic obstructive pulmonary disease) (HCC)   . Dementia   . Hyperlipidemia   . Vitamin D deficiency     Patient Active Problem List   Diagnosis Date Noted  . Syncope and collapse 08/12/2017  . SOB (shortness of breath) 05/09/2017  . Syncope 05/09/2017  . Sepsis (HCC) 03/25/2017  . HCAP (healthcare-associated pneumonia) 03/25/2017  . Dementia 03/25/2017  . HLD (hyperlipidemia) 03/25/2017  . Dehydration 07/28/2016    Past Surgical History:  Procedure Laterality Date  . NO PAST SURGERIES      Allergies Patient has no known allergies.  Social History Social History  Substance Use Topics  . Smoking status: Current Some Day Smoker  . Smokeless tobacco: Never Used  . Alcohol use No    Review of Systems unknown at this time  All systems negative/normal/unremarkable except as stated in the HPI  ____________________________________________   PHYSICAL EXAM:  VITAL SIGNS: ED Triage Vitals  Enc Vitals Group     BP 09/27/17 1202 104/84     Pulse Rate 09/27/17 1202 89     Resp 09/27/17 1202 (!) 28     Temp 09/27/17 1202 97.8 F (36.6 C)     Temp Source 09/27/17 1202 Oral     SpO2  09/27/17 1202 96 %     Weight 09/27/17 1158 140 lb (63.5 kg)     Height 09/27/17 1158  (1.651 m)     Head Circumference --      Peak Flow --      Pain Score --      Pain Loc --      Pain Edu? --      Excl. in GC? --    Constitutional: Alert but disoriented, no distress Eyes: Conjunctivae are normal. Normal extraocular movements. ENT   Head: Normocephalic and atraumatic.   Nose: No congestion/rhinnorhea.   Mouth/Throat: Mucous membranes are moist.   Neck: No stridor. Cardiovascular: Normal rate, regular rhythm. No murmurs, rubs, or gallops. Respiratory: Normal respiratory effort without tachypnea nor retractions. Breath sounds are clear and equal bilaterally. No wheezes/rales/rhonchi. Gastrointestinal: Soft and nontender. Normal bowel sounds Musculoskeletal: Nontender with normal range of motion in extremities. No lower extremity tenderness nor edema. Neurologic:  Normal speech and language. No gross focal neurologic deficits are appreciated.  Skin:  Skin is warm, dry and intact. No rash noted. Psychiatric: bizarre affect at times but this patient states she feels fine ____________________________________________  ED COURSE:  Pertinent labs & imaging results that were available during my care of the patient were reviewed by me and considered in my medical decision making (see chart for details). Patient presents for altered mental status, we will assess with labs and imaging as indicated. Clinical  Course as of Sep 28 1455  Wed Sep 27, 2017  1415 patient appears to be demented but otherwise in no distress. She is mildly dehydrated and was given IV fluid.  [JW]    Clinical Course User Index [JW] Emily Filbert, MD   Procedures ____________________________________________   LABS (pertinent positives/negatives)  Labs Reviewed  GLUCOSE, CAPILLARY - Abnormal; Notable for the following:       Result Value   Glucose-Capillary 147 (*)    All other components  within normal limits  CBC WITH DIFFERENTIAL/PLATELET - Abnormal; Notable for the following:    RBC 3.74 (*)    Hemoglobin 10.9 (*)    HCT 32.0 (*)    RDW 14.8 (*)    All other components within normal limits  COMPREHENSIVE METABOLIC PANEL - Abnormal; Notable for the following:    Creatinine, Ser 1.71 (*)    Calcium 11.0 (*)    Albumin 3.4 (*)    Alkaline Phosphatase 181 (*)    GFR calc non Af Amer 28 (*)    GFR calc Af Amer 33 (*)    All other components within normal limits  TROPONIN I  URINALYSIS, COMPLETE (UACMP) WITH MICROSCOPIC    RADIOLOGY  CT head is unremarkable chest x-ray does not reveal any acute process ____________________________________________  DIFFERENTIAL DIAGNOSIS   dementia, CVA, systemic infection, UTI, pneumonia, like slight disturbance, dehydration, medication side effects   FINAL ASSESSMENT AND PLAN  dementia, altered mental status   Plan: Patient's labs and imaging were dictated above. Patient had presented for an episode of altered mental status. She did appear somewhat somewhat dehydrated based on lab work and we did give her IV fluids here.she is stable for outpatient follow-up.   Emily Filbert, MD   Note: This note was generated in part or whole with voice recognition software. Voice recognition is usually quite accurate but there are transcription errors that can and very often do occur. I apologize for any typographical errors that were not detected and corrected.     Emily Filbert, MD 09/27/17 [phone]

## 2017-09-27 NOTE — ED Triage Notes (Signed)
Patient arrived by EMS from [facility]. Staff was concerned patient was having a stroke. Staff had taken patient outside to walk around and when they came inside they reported patient became sweaty and talking in a whispering voice. Patient was ambulatory with EMS. HX dementia and COPD

## 2017-09-27 NOTE — ED Notes (Signed)
Patient assisted to toilet but unable to sit down on toilet. Patient I/O by this RN and steven RN and soiled brief changed

## 2017-10-01 ENCOUNTER — Encounter: Payer: Self-pay | Admitting: Emergency Medicine

## 2017-10-01 ENCOUNTER — Emergency Department
Admission: EM | Admit: 2017-10-01 | Discharge: 2017-10-01 | Disposition: A | Discharge status: Home / Self Care | Payer: Medicare Other | Attending: Emergency Medicine | Admitting: Emergency Medicine

## 2017-10-01 ENCOUNTER — Emergency Department: Payer: Medicare Other

## 2017-10-01 DIAGNOSIS — Z720 Tobacco use: Secondary | ICD-10-CM | POA: Insufficient documentation

## 2017-10-01 DIAGNOSIS — M25552 Pain in left hip: Secondary | ICD-10-CM | POA: Insufficient documentation

## 2017-10-01 DIAGNOSIS — J449 Chronic obstructive pulmonary disease, unspecified: Secondary | ICD-10-CM | POA: Insufficient documentation

## 2017-10-01 DIAGNOSIS — F0390 Unspecified dementia without behavioral disturbance: Secondary | ICD-10-CM | POA: Insufficient documentation

## 2017-10-01 DIAGNOSIS — M25559 Pain in unspecified hip: Secondary | ICD-10-CM

## 2017-10-01 NOTE — ED Triage Notes (Signed)
Pt brought in by [facility] from [facility]. Pt had fall on Wednesday, was seen and D/C from ED. Today pt has not been able to walk and has had pain and swelling in the left upper thigh.

## 2017-10-01 NOTE — ED Notes (Signed)
Waiting on EMS transport 

## 2017-10-01 NOTE — ED Notes (Signed)
Per patricia POA, unable to find ride for pt.  She would like EMS to take pt home.

## 2017-10-01 NOTE — ED Notes (Signed)
Pt able to stand and bear weight. Seems confused when asked to try and walk. Dr Mayford Knife notified. Ok for discharge.

## 2017-10-01 NOTE — ED Notes (Signed)
Called EMS for transport to [facility]   [id number]

## 2017-10-01 NOTE — ED Notes (Signed)
Have spoken with legal guardian patricia, she is trying to find pt ride home before EMS called.

## 2017-10-01 NOTE — ED Notes (Signed)
Left with EMS back to [facility].

## 2017-10-01 NOTE — ED Notes (Signed)
Hooked patient up to monitor. 

## 2017-10-01 NOTE — ED Provider Notes (Signed)
[facility] Emergency Department Provider Note       Time seen: ----------------------------------------- 1:53 PM on 10/01/2017 -----------------------------------------  Level V caveat: History/ROS limited by dementia   I have reviewed the triage vital signs and the nursing notes.   HISTORY   Chief Complaint Fall    HPI Candice Randall is a 71 y.o. female with a history of severe dementia and was recently seen here for altered mental status with a remote history of fall, who presents to the ED for left hip pain. Reportedly she has been complaining of left hip pain and has been less ambulatory than normal. Patient cannot give any further review of systems report.  Past Medical History:  Diagnosis Date  . COPD (chronic obstructive pulmonary disease) (HCC)   . Dementia   . Hyperlipidemia   . Vitamin D deficiency     Patient Active Problem List   Diagnosis Date Noted  . Syncope and collapse 08/12/2017  . SOB (shortness of breath) 05/09/2017  . Syncope 05/09/2017  . Sepsis (HCC) 03/25/2017  . HCAP (healthcare-associated pneumonia) 03/25/2017  . Dementia 03/25/2017  . HLD (hyperlipidemia) 03/25/2017  . Dehydration 07/28/2016    Past Surgical History:  Procedure Laterality Date  . NO PAST SURGERIES      Allergies Patient has no known allergies.  Social History Social History  Substance Use Topics  . Smoking status: Current Some Day Smoker  . Smokeless tobacco: Never Used  . Alcohol use No    Review of Systems reported pain in the left hip  All systems negative/normal/unremarkable except as stated in the HPI  ____________________________________________   PHYSICAL EXAM:  VITAL SIGNS: ED Triage Vitals  Enc Vitals Group     BP      Pulse      Resp      Temp      Temp src      SpO2      Weight      Height      Head Circumference      Peak Flow      Pain Score      Pain Loc      Pain Edu?      Excl. in GC?      Constitutional: Alert but disoriented, no distress Eyes: Conjunctivae are normal. Normal extraocular movements. ENT   Head: Normocephalic and atraumatic.   Nose: No congestion/rhinnorhea.   Mouth/Throat: Mucous membranes are moist.   Neck: No stridor. Cardiovascular: Normal rate, regular rhythm. No murmurs, rubs, or gallops. Respiratory: Normal respiratory effort without tachypnea nor retractions. Breath sounds are clear and equal bilaterally. No wheezes/rales/rhonchi. Gastrointestinal: Soft and nontender. Normal bowel sounds Musculoskeletal: pain is elicited with range of motion of the left hip, also in the right hip Neurologic:  No gross focal neurologic deficits are appreciated.  Skin:  Skin is warm, dry and intact. No rash noted. Psychiatric: bizarre affect at times ____________________________________________  ED COURSE:  Pertinent labs & imaging results that were available during my care of the patient were reviewed by me and considered in my medical decision making (see chart for details). Patient presents for left hip pain after a reported recent fall, we will assess with labs and imaging as indicated.   Procedures ____________________________________________   LABS (pertinent positives/negatives)  Labs Reviewed - No data to display  RADIOLOGY Images were viewed by me  left hip x-rays did not reveal any acute process ____________________________________________  DIFFERENTIAL DIAGNOSIS   weakness, contusion, hip fracture,  dislocation, pelvic fracture, muscle strain  FINAL ASSESSMENT AND PLAN  hip pain   Plan: Patient had presented for possible hip pain. Patients imaging revealed no obvious fracture. There is no obvious soft tissue swelling either. There is some arthritis on her x-ray but otherwise x-rays are negative. She'll be discharged with close outpatient follow-up. Just as in the prior evaluation she was able to ambulate prior to  discharge.   Emily Filbert, MD   Note: This note was generated in part or whole with voice recognition software. Voice recognition is usually quite accurate but there are transcription errors that can and very often do occur. I apologize for any typographical errors that were not detected and corrected.     Emily Filbert, MD 10/01/17 [phone]

## 2017-10-05 ENCOUNTER — Emergency Department: Payer: Medicare Other

## 2017-10-05 ENCOUNTER — Inpatient Hospital Stay
Admission: EM | Admit: 2017-10-05 | Discharge: 2017-10-26 | DRG: 640 | Disposition: E | Payer: Medicare Other | Attending: Pulmonary Disease | Admitting: Pulmonary Disease

## 2017-10-05 ENCOUNTER — Encounter: Payer: Self-pay | Admitting: *Deleted

## 2017-10-05 DIAGNOSIS — E872 Acidosis: Secondary | ICD-10-CM | POA: Diagnosis present

## 2017-10-05 DIAGNOSIS — F17200 Nicotine dependence, unspecified, uncomplicated: Secondary | ICD-10-CM | POA: Diagnosis present

## 2017-10-05 DIAGNOSIS — Z0189 Encounter for other specified special examinations: Secondary | ICD-10-CM

## 2017-10-05 DIAGNOSIS — E785 Hyperlipidemia, unspecified: Secondary | ICD-10-CM | POA: Diagnosis present

## 2017-10-05 DIAGNOSIS — R109 Unspecified abdominal pain: Secondary | ICD-10-CM | POA: Diagnosis present

## 2017-10-05 DIAGNOSIS — J449 Chronic obstructive pulmonary disease, unspecified: Secondary | ICD-10-CM | POA: Diagnosis present

## 2017-10-05 DIAGNOSIS — R740 Nonspecific elevation of levels of transaminase and lactic acid dehydrogenase [LDH]: Secondary | ICD-10-CM | POA: Diagnosis present

## 2017-10-05 DIAGNOSIS — R7989 Other specified abnormal findings of blood chemistry: Secondary | ICD-10-CM

## 2017-10-05 DIAGNOSIS — Z6823 Body mass index (BMI) 23.0-23.9, adult: Secondary | ICD-10-CM

## 2017-10-05 DIAGNOSIS — D72829 Elevated white blood cell count, unspecified: Secondary | ICD-10-CM | POA: Diagnosis present

## 2017-10-05 DIAGNOSIS — R4182 Altered mental status, unspecified: Secondary | ICD-10-CM

## 2017-10-05 DIAGNOSIS — Z66 Do not resuscitate: Secondary | ICD-10-CM | POA: Diagnosis not present

## 2017-10-05 DIAGNOSIS — N179 Acute kidney failure, unspecified: Secondary | ICD-10-CM | POA: Diagnosis present

## 2017-10-05 DIAGNOSIS — K56609 Unspecified intestinal obstruction, unspecified as to partial versus complete obstruction: Secondary | ICD-10-CM | POA: Diagnosis present

## 2017-10-05 DIAGNOSIS — K0889 Other specified disorders of teeth and supporting structures: Secondary | ICD-10-CM | POA: Diagnosis not present

## 2017-10-05 DIAGNOSIS — J9600 Acute respiratory failure, unspecified whether with hypoxia or hypercapnia: Secondary | ICD-10-CM | POA: Diagnosis present

## 2017-10-05 DIAGNOSIS — I959 Hypotension, unspecified: Secondary | ICD-10-CM | POA: Diagnosis present

## 2017-10-05 DIAGNOSIS — Z978 Presence of other specified devices: Secondary | ICD-10-CM

## 2017-10-05 DIAGNOSIS — Z515 Encounter for palliative care: Secondary | ICD-10-CM | POA: Diagnosis not present

## 2017-10-05 DIAGNOSIS — Z79899 Other long term (current) drug therapy: Secondary | ICD-10-CM

## 2017-10-05 DIAGNOSIS — Z7982 Long term (current) use of aspirin: Secondary | ICD-10-CM

## 2017-10-05 DIAGNOSIS — F0390 Unspecified dementia without behavioral disturbance: Secondary | ICD-10-CM | POA: Diagnosis present

## 2017-10-05 DIAGNOSIS — R64 Cachexia: Secondary | ICD-10-CM | POA: Diagnosis present

## 2017-10-05 DIAGNOSIS — G9340 Encephalopathy, unspecified: Secondary | ICD-10-CM | POA: Diagnosis present

## 2017-10-05 DIAGNOSIS — I469 Cardiac arrest, cause unspecified: Secondary | ICD-10-CM

## 2017-10-05 LAB — CBC WITH DIFFERENTIAL/PLATELET
BASOS ABS: 0 10*3/uL (ref 0–0.1)
BASOS PCT: 0 %
EOS ABS: 0 10*3/uL (ref 0–0.7)
EOS PCT: 0 %
HCT: 35 % (ref 35.0–47.0)
Hemoglobin: 11.6 g/dL — ABNORMAL LOW (ref 12.0–16.0)
Lymphocytes Relative: 21 %
Lymphs Abs: 2.4 10*3/uL (ref 1.0–3.6)
MCH: 28.4 pg (ref 26.0–34.0)
MCHC: 33.2 g/dL (ref 32.0–36.0)
MCV: 85.6 fL (ref 80.0–100.0)
Monocytes Absolute: 0.9 10*3/uL (ref 0.2–0.9)
Monocytes Relative: 8 %
Neutro Abs: 8.1 10*3/uL — ABNORMAL HIGH (ref 1.4–6.5)
Neutrophils Relative %: 71 %
PLATELETS: 244 10*3/uL (ref 150–440)
RBC: 4.09 MIL/uL (ref 3.80–5.20)
RDW: 14.6 % — ABNORMAL HIGH (ref 11.5–14.5)
WBC: 11.5 10*3/uL — AB (ref 3.6–11.0)

## 2017-10-05 LAB — URINALYSIS, COMPLETE (UACMP) WITH MICROSCOPIC
BACTERIA UA: NONE SEEN
Bilirubin Urine: NEGATIVE
Glucose, UA: NEGATIVE mg/dL
Hgb urine dipstick: NEGATIVE
Ketones, ur: NEGATIVE mg/dL
Leukocytes, UA: NEGATIVE
NITRITE: NEGATIVE
PROTEIN: NEGATIVE mg/dL
Specific Gravity, Urine: 1.015 (ref 1.005–1.030)
pH: 5 (ref 5.0–8.0)

## 2017-10-05 LAB — COMPREHENSIVE METABOLIC PANEL
ALT: 16 U/L (ref 14–54)
AST: 36 U/L (ref 15–41)
Albumin: 3.4 g/dL — ABNORMAL LOW (ref 3.5–5.0)
Alkaline Phosphatase: 193 U/L — ABNORMAL HIGH (ref 38–126)
Anion gap: 11 (ref 5–15)
BUN: 33 mg/dL — ABNORMAL HIGH (ref 6–20)
CHLORIDE: 109 mmol/L (ref 101–111)
CO2: 25 mmol/L (ref 22–32)
Calcium: 13.4 mg/dL (ref 8.9–10.3)
Creatinine, Ser: 1.37 mg/dL — ABNORMAL HIGH (ref 0.44–1.00)
GFR, EST AFRICAN AMERICAN: 43 mL/min — AB (ref >60)
GFR, EST NON AFRICAN AMERICAN: 37 mL/min — AB (ref >60)
Glucose, Bld: 124 mg/dL — ABNORMAL HIGH (ref 65–99)
Potassium: 4.8 mmol/L (ref 3.5–5.1)
Sodium: 145 mmol/L (ref 135–145)
Total Bilirubin: 0.5 mg/dL (ref 0.3–1.2)
Total Protein: 8.6 g/dL — ABNORMAL HIGH (ref 6.5–8.1)

## 2017-10-05 LAB — BLOOD GAS, ARTERIAL
Acid-Base Excess: 1.1 mmol/L (ref 0.0–2.0)
Bicarbonate: 25.1 mmol/L (ref 20.0–28.0)
FIO2: 0.28
O2 SAT: 99.1 %
PO2 ART: 131 mmHg — AB (ref 83.0–108.0)
Patient temperature: 37
pCO2 arterial: 37 mmHg (ref 32.0–48.0)
pH, Arterial: 7.44 (ref 7.350–7.450)

## 2017-10-05 LAB — BRAIN NATRIURETIC PEPTIDE: B NATRIURETIC PEPTIDE 5: 19 pg/mL (ref 0.0–100.0)

## 2017-10-05 LAB — LIPASE, BLOOD: LIPASE: 21 U/L (ref 11–51)

## 2017-10-05 LAB — TROPONIN I

## 2017-10-05 LAB — LACTIC ACID, PLASMA: LACTIC ACID, VENOUS: 3.2 mmol/L — AB (ref 0.5–1.9)

## 2017-10-05 MED ORDER — MORPHINE SULFATE (PF) 2 MG/ML IV SOLN: 2.0000 mg | Freq: Once | INTRAVENOUS | Status: DC

## 2017-10-05 MED ORDER — ONDANSETRON HCL 4 MG/2ML IJ SOLN
4.0000 mg | Freq: Once | INTRAMUSCULAR | Status: AC
  Administered 2017-10-05: 4 mg via INTRAVENOUS
  Filled 2017-10-05: qty 2

## 2017-10-05 MED ORDER — IOPAMIDOL (ISOVUE-300) INJECTION 61%: 30.0000 mL | Freq: Once | INTRAVENOUS | Status: DC

## 2017-10-05 MED ORDER — MORPHINE SULFATE (PF) 2 MG/ML IV SOLN
2.0000 mg | Freq: Once | INTRAVENOUS | Status: AC
  Administered 2017-10-05: 2 mg via INTRAVENOUS
  Filled 2017-10-05: qty 1

## 2017-10-05 MED ORDER — IOPAMIDOL (ISOVUE-300) INJECTION 61%
75.0000 mL | Freq: Once | INTRAVENOUS | Status: AC | PRN
Start: 2017-10-05 — End: 2017-10-05
  Administered 2017-10-05: 75 mL via INTRAVENOUS

## 2017-10-05 MED ORDER — SODIUM CHLORIDE 0.9 % IV SOLN
Freq: Once | INTRAVENOUS | Status: AC
  Administered 2017-10-05: 22:00:00 via INTRAVENOUS

## 2017-10-05 NOTE — ED Notes (Signed)
Oxygen saturation 100% on NRB with good wave form. Pt placed back on [place] at this time.

## 2017-10-05 NOTE — ED Notes (Signed)
Respiratory notified of ABG order

## 2017-10-05 NOTE — ED Provider Notes (Addendum)
[facility] Emergency Department Provider Note   ____________________________________________   None    (approximate)  I have reviewed the triage vital signs and the nursing notes.   HISTORY  Chief Complaint Altered Mental Status and Abdominal Pain  history difficult to obtain because of dementia  HPI Candice Randall is a 71 y.o. female he was sent from [facility] for a "second opinion" about the possibility of bowel obstruction which was diagnosed 2 days ago her doctor. Patient complains of abdominal pain which is diffuse and seems to be worse lower in the abdomen. Pain appears to be severe and much sure if it's crampy or not. Patient does have dementia which interfere somewhat with the history.no history of nausea vomiting.   Past Medical History:  Diagnosis Date  . COPD (chronic obstructive pulmonary disease) (HCC)   . Dementia   . Hyperlipidemia   . Vitamin D deficiency     Patient Active Problem List   Diagnosis Date Noted  . Syncope and collapse 08/12/2017  . SOB (shortness of breath) 05/09/2017  . Syncope 05/09/2017  . Sepsis (HCC) 03/25/2017  . HCAP (healthcare-associated pneumonia) 03/25/2017  . Dementia 03/25/2017  . HLD (hyperlipidemia) 03/25/2017  . Dehydration 07/28/2016    Past Surgical History:  Procedure Laterality Date  . NO PAST SURGERIES      Prior to Admission medications   Medication Sig Start Date End Date Taking? Authorizing Provider  acetaminophen (TYLENOL) 500 MG tablet Take 500 mg by mouth 2 (two) times daily.    [provider]  albuterol (PROVENTIL) (2.5 MG/3ML) 0.083% nebulizer solution Take 3 mLs (2.5 mg total) by nebulization every 6 (six) hours as needed for wheezing or shortness of breath. Patient not taking: Reported on 09/27/2017 03/29/17   Ramonita Lab, MD  AMBULATORY NON FORMULARY MEDICATION Medication Name: dispense nebulizer machine Dx:J44.9 Patient not taking: Reported on 09/27/2017 06/15/17    Erin Fulling, MD  aspirin EC 81 MG tablet Take 81 mg by mouth daily.    [provider]  benzonatate (TESSALON) 200 MG capsule Take 1 capsule (200 mg total) by mouth 3 (three) times daily as needed for cough. 03/29/17   Gouru, Deanna Artis, MD  budesonide (PULMICORT) 0.5 MG/2ML nebulizer solution Take 2 mLs (0.5 mg total) by nebulization 2 (two) times daily. DX.J44.1 Patient not taking: Reported on 09/27/2017 06/08/17 06/08/18  Erin Fulling, MD  cholecalciferol (VITAMIN D) 1000 units tablet Take 1,000 Units by mouth daily.    [provider]  colchicine 0.6 MG tablet Take 0.6 mg by mouth 2 (two) times daily.    [provider]  donepezil (ARICEPT) 5 MG tablet Take 5 mg by mouth at bedtime.    [provider]  ferrous sulfate 325 (65 FE) MG tablet Take 325 mg by mouth daily with breakfast.    [provider]  folic acid (FOLVITE) 1 MG tablet Take 1 mg by mouth daily.    [provider]  loratadine (CLARITIN) 10 MG tablet Take 10 mg by mouth daily.    [provider]  LORazepam (ATIVAN) 0.5 MG tablet Take 1 tablet (0.5 mg total) by mouth 2 (two) times daily as needed for anxiety. 03/29/17   Gouru, Deanna Artis, MD  LORazepam (ATIVAN) 0.5 MG tablet Take 0.5 mg by mouth 2 (two) times daily.    [provider]  mirtazapine (REMERON) 7.5 MG tablet Take 7.5 mg by mouth at bedtime.    [provider]  Neomycin-Bacitracin-Polymyxin (TRIPLE ANTIBIOTIC) [phone]  OINT Apply 1 application topically daily. Apply to hand laceration and Band-Aid until healed.    [provider]  pravastatin (PRAVACHOL) 10 MG tablet Take 10 mg by mouth at bedtime.    [provider]  QUEtiapine (SEROQUEL) 50 MG tablet Take 50 mg by mouth 2 (two) times daily.    [provider]    Allergies Patient has no known allergies.  Family History  Problem Relation Age of Onset  . Family history unknown: Yes    Social History Social History   Substance Use Topics  . Smoking status: Current Some Day Smoker  . Smokeless tobacco: Never Used  . Alcohol use No    Review of Systems   unable to obtain ____________________________________________   PHYSICAL EXAM:  VITAL SIGNS: ED Triage Vitals  Enc Vitals Group     BP --      Pulse --      Resp --      Temp --      Temp src --      SpO2 2017-10-24 2037 98 %     Weight 2017/10/24 2038 140 lb (63.5 kg)     Height October 24, 2017 2038  (1.651 m)     Head Circumference --      Peak Flow --      Pain Score --      Pain Loc --      Pain Edu? --      Excl. in GC? --     Constitutional: Alert in pain asking for something for pain Eyes: Conjunctivae are normal.  Head: Atraumatic. Nose: No congestion/rhinnorhea. Mouth/Throat: Mucous membranes are moist.  Oropharynx non-erythematous. Neck: No stridor.  Cardiovascular: rapid rate, regular rhythm. Grossly normal heart sounds.  Good peripheral circulation. Respiratory: Normal respiratory effort.  No retractions. Lungs CTAB. Gastrointestinal: Soft diffusely tender no bowel sounds No distention. No abdominal bruits. No CVA tenderness. Musculoskeletal: No lower extremity tenderness nor edema.  No joint effusions. Neurologic:  Normal speech and language. No gross focal neurologic deficits are appreciated.  Skin:  Skin is warm, dry and intact. No rash noted.   ____________________________________________   LABS (all labs ordered are listed, but only abnormal results are displayed)  Labs Reviewed  COMPREHENSIVE METABOLIC PANEL - Abnormal; Notable for the following:       Result Value   Glucose, Bld 124 (*)    BUN 33 (*)    Creatinine, Ser 1.37 (*)    Calcium 13.4 (*)    Total Protein 8.6 (*)    Albumin 3.4 (*)    Alkaline Phosphatase 193 (*)    GFR calc non Af Amer 37 (*)    GFR calc Af Amer 43 (*)    All other components within normal limits  LACTIC ACID, PLASMA - Abnormal; Notable for the following:    Lactic Acid,  Venous 3.2 (*)    All other components within normal limits  CBC WITH DIFFERENTIAL/PLATELET - Abnormal; Notable for the following:    WBC 11.5 (*)    Hemoglobin 11.6 (*)    RDW 14.6 (*)    Neutro Abs 8.1 (*)    All other components within normal limits  URINALYSIS, COMPLETE (UACMP) WITH MICROSCOPIC - Abnormal; Notable for the following:    Color, Urine YELLOW (*)    APPearance HAZY (*)    Squamous Epithelial / LPF 0-5 (*)    All other components within normal limits  BLOOD GAS, ARTERIAL - Abnormal; Notable for the following:  pO2, Arterial 131 (*)    All other components within normal limits  URINE CULTURE  LIPASE, BLOOD  TROPONIN I  BRAIN NATRIURETIC PEPTIDE  LACTIC ACID, PLASMA   ____________________________________________  EKG   ____________________________________________  RADIOLOGY  CT shows no acute pathology 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 but it does not appear to be doing anything at present____________________________________________   PROCEDURES  Procedure(s) performed:   Procedures  Critical Care performed:   ____________________________________________   INITIAL IMPRESSION / ASSESSMENT AND PLAN / ED COURSE        ____________________________________________   FINAL CLINICAL IMPRESSION(S) / ED DIAGNOSES  Final diagnoses:  Altered mental status, unspecified altered mental status type  Hypercalcemia  Elevated lactic acid level      NEW MEDICATIONS STARTED DURING THIS VISIT:  New Prescriptions   No medications on file     Note:  This document was prepared using Dragon voice recognition software and may include unintentional dictation errors.    Arnaldo Natal, MD October 13, 2017 2350    Arnaldo Natal, MD 10/12/2017 2306

## 2017-10-05 NOTE — ED Notes (Signed)
Dr. Darnelle Catalan notified of HR 119, BP responded to morphine, and oxygen of 88-91% on 15L NRB.

## 2017-10-05 NOTE — ED Notes (Signed)
Delight Stare, daughter, updated about patient's status.  [phone]

## 2017-10-05 NOTE — ED Notes (Signed)
PT placed on NRB due to continued low oxygen saturation after medication administration. Pt has been placed in trendelenburg due to BP and MD made aware.

## 2017-10-05 NOTE — ED Notes (Signed)
Patient transported to CT 

## 2017-10-05 NOTE — ED Triage Notes (Signed)
Pt to ED from [facility] after staff reported, "she is just not acting right." Pt is verbalizing abd pain. No known hx for recent BM, nausea or vomiting. Staff did not updated EMS per EMS and pt is confused upon arrival.   Pt is oriented to self and responds to voice upon arrival but is answering questions with incomprehensible words.

## 2017-10-06 ENCOUNTER — Inpatient Hospital Stay: Payer: Medicare Other

## 2017-10-06 DIAGNOSIS — D72829 Elevated white blood cell count, unspecified: Secondary | ICD-10-CM | POA: Diagnosis present

## 2017-10-06 DIAGNOSIS — Z978 Presence of other specified devices: Secondary | ICD-10-CM

## 2017-10-06 DIAGNOSIS — E785 Hyperlipidemia, unspecified: Secondary | ICD-10-CM | POA: Diagnosis present

## 2017-10-06 DIAGNOSIS — R740 Nonspecific elevation of levels of transaminase and lactic acid dehydrogenase [LDH]: Secondary | ICD-10-CM | POA: Diagnosis present

## 2017-10-06 DIAGNOSIS — K0889 Other specified disorders of teeth and supporting structures: Secondary | ICD-10-CM | POA: Diagnosis not present

## 2017-10-06 DIAGNOSIS — F0390 Unspecified dementia without behavioral disturbance: Secondary | ICD-10-CM | POA: Diagnosis present

## 2017-10-06 DIAGNOSIS — Z7982 Long term (current) use of aspirin: Secondary | ICD-10-CM | POA: Diagnosis not present

## 2017-10-06 DIAGNOSIS — J9600 Acute respiratory failure, unspecified whether with hypoxia or hypercapnia: Secondary | ICD-10-CM | POA: Diagnosis present

## 2017-10-06 DIAGNOSIS — N179 Acute kidney failure, unspecified: Secondary | ICD-10-CM | POA: Diagnosis present

## 2017-10-06 DIAGNOSIS — Z79899 Other long term (current) drug therapy: Secondary | ICD-10-CM | POA: Diagnosis not present

## 2017-10-06 DIAGNOSIS — Z0189 Encounter for other specified special examinations: Secondary | ICD-10-CM | POA: Diagnosis not present

## 2017-10-06 DIAGNOSIS — K56609 Unspecified intestinal obstruction, unspecified as to partial versus complete obstruction: Secondary | ICD-10-CM | POA: Diagnosis present

## 2017-10-06 DIAGNOSIS — R109 Unspecified abdominal pain: Secondary | ICD-10-CM | POA: Diagnosis present

## 2017-10-06 DIAGNOSIS — Z6823 Body mass index (BMI) 23.0-23.9, adult: Secondary | ICD-10-CM | POA: Diagnosis not present

## 2017-10-06 DIAGNOSIS — R4182 Altered mental status, unspecified: Secondary | ICD-10-CM | POA: Diagnosis not present

## 2017-10-06 DIAGNOSIS — I469 Cardiac arrest, cause unspecified: Secondary | ICD-10-CM | POA: Diagnosis present

## 2017-10-06 DIAGNOSIS — G9340 Encephalopathy, unspecified: Secondary | ICD-10-CM | POA: Diagnosis present

## 2017-10-06 DIAGNOSIS — R7989 Other specified abnormal findings of blood chemistry: Secondary | ICD-10-CM | POA: Diagnosis not present

## 2017-10-06 DIAGNOSIS — R64 Cachexia: Secondary | ICD-10-CM | POA: Diagnosis present

## 2017-10-06 DIAGNOSIS — E872 Acidosis: Secondary | ICD-10-CM | POA: Diagnosis present

## 2017-10-06 DIAGNOSIS — F17200 Nicotine dependence, unspecified, uncomplicated: Secondary | ICD-10-CM | POA: Diagnosis present

## 2017-10-06 DIAGNOSIS — J449 Chronic obstructive pulmonary disease, unspecified: Secondary | ICD-10-CM | POA: Diagnosis present

## 2017-10-06 DIAGNOSIS — I959 Hypotension, unspecified: Secondary | ICD-10-CM | POA: Diagnosis present

## 2017-10-06 DIAGNOSIS — Z515 Encounter for palliative care: Secondary | ICD-10-CM | POA: Diagnosis not present

## 2017-10-06 DIAGNOSIS — Z66 Do not resuscitate: Secondary | ICD-10-CM | POA: Diagnosis not present

## 2017-10-06 LAB — COMPREHENSIVE METABOLIC PANEL
ALBUMIN: 1.5 g/dL — AB (ref 3.5–5.0)
ALT: 401 U/L — AB (ref 14–54)
AST: 727 U/L — AB (ref 15–41)
Alkaline Phosphatase: 119 U/L (ref 38–126)
Anion gap: 17 — ABNORMAL HIGH (ref 5–15)
BILIRUBIN TOTAL: 0.6 mg/dL (ref 0.3–1.2)
BUN: 31 mg/dL — AB (ref 6–20)
CHLORIDE: 112 mmol/L — AB (ref 101–111)
CO2: 22 mmol/L (ref 22–32)
Calcium: 10.9 mg/dL — ABNORMAL HIGH (ref 8.9–10.3)
Creatinine, Ser: 1.6 mg/dL — ABNORMAL HIGH (ref 0.44–1.00)
GFR calc Af Amer: 36 mL/min — ABNORMAL LOW (ref >60)
GFR calc non Af Amer: 31 mL/min — ABNORMAL LOW (ref >60)
GLUCOSE: 301 mg/dL — AB (ref 65–99)
POTASSIUM: 5 mmol/L (ref 3.5–5.1)
Sodium: 151 mmol/L — ABNORMAL HIGH (ref 135–145)
TOTAL PROTEIN: 4.2 g/dL — AB (ref 6.5–8.1)

## 2017-10-06 LAB — CBC
HCT: 21.6 % — ABNORMAL LOW (ref 35.0–47.0)
HEMOGLOBIN: 6.9 g/dL — AB (ref 12.0–16.0)
MCH: 28 pg (ref 26.0–34.0)
MCHC: 31.9 g/dL — AB (ref 32.0–36.0)
MCV: 87.8 fL (ref 80.0–100.0)
Platelets: 124 10*3/uL — ABNORMAL LOW (ref 150–440)
RBC: 2.46 MIL/uL — AB (ref 3.80–5.20)
RDW: 15 % — AB (ref 11.5–14.5)
WBC: 19.1 10*3/uL — AB (ref 3.6–11.0)

## 2017-10-06 LAB — TSH: TSH: 2.171 u[IU]/mL (ref 0.350–4.500)

## 2017-10-06 LAB — TROPONIN I: Troponin I: 19.59 ng/mL (ref <0.03)

## 2017-10-06 LAB — GLUCOSE, CAPILLARY: Glucose-Capillary: 102 mg/dL — ABNORMAL HIGH (ref 65–99)

## 2017-10-06 LAB — LACTIC ACID, PLASMA: Lactic Acid, Venous: 1.5 mmol/L (ref 0.5–1.9)

## 2017-10-06 MED ORDER — ENOXAPARIN SODIUM 30 MG/0.3ML ~~LOC~~ SOLN: 30.0000 mg | SUBCUTANEOUS | Status: DC

## 2017-10-06 MED ORDER — ALBUTEROL SULFATE (2.5 MG/3ML) 0.083% IN NEBU: 2.5000 mg | INHALATION_SOLUTION | Freq: Four times a day (QID) | RESPIRATORY_TRACT | Status: DC | PRN

## 2017-10-06 MED ORDER — SODIUM BICARBONATE 8.4 % IV SOLN
INTRAVENOUS | Status: AC
  Filled 2017-10-06: qty 100

## 2017-10-06 MED ORDER — SODIUM BICARBONATE 8.4 % IV SOLN
50.0000 meq | Freq: Once | INTRAVENOUS | Status: AC
  Administered 2017-10-06: 50 meq via INTRAVENOUS

## 2017-10-06 MED ORDER — DEXTROSE 5 % IV SOLN
0.0000 ug/min | INTRAVENOUS | Status: DC
  Administered 2017-10-06: 20 ug/min via INTRAVENOUS
  Filled 2017-10-06: qty 4

## 2017-10-06 MED ORDER — VASOPRESSIN 20 UNIT/ML IV SOLN
0.0400 [IU]/min | INTRAVENOUS | Status: DC
  Filled 2017-10-06: qty 2

## 2017-10-06 MED ORDER — PANTOPRAZOLE SODIUM 40 MG IV SOLR: 40.0000 mg | INTRAVENOUS | Status: DC

## 2017-10-06 MED ORDER — SODIUM CHLORIDE 0.9 % IV SOLN: INTRAVENOUS | Status: DC

## 2017-10-06 MED ORDER — ONDANSETRON HCL 4 MG/2ML IJ SOLN: 4.0000 mg | Freq: Four times a day (QID) | INTRAMUSCULAR | Status: DC | PRN

## 2017-10-06 MED ORDER — ATROPINE SULFATE 1 MG/10ML IJ SOSY
PREFILLED_SYRINGE | INTRAMUSCULAR | Status: AC
  Filled 2017-10-06: qty 10

## 2017-10-06 MED ORDER — SODIUM BICARBONATE 8.4 % IV SOLN
INTRAVENOUS | Status: DC
  Administered 2017-10-06: 05:00:00 via INTRAVENOUS
  Filled 2017-10-06 (×3): qty 150

## 2017-10-06 MED ORDER — MAGNESIUM CITRATE PO SOLN
1.0000 | Freq: Once | ORAL | Status: DC | PRN
Start: 2017-10-06 — End: 2017-10-06
  Filled 2017-10-06: qty 296

## 2017-10-06 MED ORDER — SODIUM BICARBONATE 8.4 % IV SOLN
INTRAVENOUS | Status: AC
  Filled 2017-10-06: qty 150

## 2017-10-06 MED ORDER — TRIPLE ANTIBIOTIC 3.5-400-5000 EX OINT
1.0000 "application " | TOPICAL_OINTMENT | Freq: Every day | CUTANEOUS | Status: DC
  Filled 2017-10-06: qty 1

## 2017-10-06 MED ORDER — IPRATROPIUM BROMIDE 0.02 % IN SOLN: 0.5000 mg | Freq: Four times a day (QID) | RESPIRATORY_TRACT | Status: DC | PRN

## 2017-10-06 MED ORDER — SODIUM CHLORIDE 0.9 % IV SOLN
INTRAVENOUS | Status: DC
  Administered 2017-10-06: 05:00:00 via INTRAVENOUS

## 2017-10-06 MED ORDER — ONDANSETRON HCL 4 MG PO TABS: 4.0000 mg | ORAL_TABLET | Freq: Four times a day (QID) | ORAL | Status: DC | PRN

## 2017-10-06 MED ORDER — SODIUM CHLORIDE 0.9 % IV SOLN
0.0000 ug/min | INTRAVENOUS | Status: DC
  Administered 2017-10-06: 100 ug/min via INTRAVENOUS
  Filled 2017-10-06 (×2): qty 4

## 2017-10-06 MED ORDER — GUAIFENESIN ER 600 MG PO TB12: 600.0000 mg | ORAL_TABLET | Freq: Two times a day (BID) | ORAL | Status: DC | PRN

## 2017-10-07 LAB — PTH, INTACT AND CALCIUM
CALCIUM TOTAL (PTH): 11.5 mg/dL — AB (ref 8.7–10.3)
PTH: 35 pg/mL (ref 15–65)

## 2017-10-07 LAB — URINE CULTURE: CULTURE: NO GROWTH

## 2017-10-08 LAB — VITAMIN D 25 HYDROXY (VIT D DEFICIENCY, FRACTURES): VIT D 25 HYDROXY: 34.2 ng/mL (ref 30.0–100.0)

## 2017-10-16 ENCOUNTER — Telehealth: Payer: Self-pay | Admitting: Pulmonary Disease

## 2017-10-16 NOTE — Telephone Encounter (Signed)
Death cert placed in DS' folder to be signed.  

## 2017-10-16 NOTE — Telephone Encounter (Signed)
[facility]ie[facility] [employer] Delivered to [place]

## 2017-10-18 NOTE — Telephone Encounter (Signed)
Informed [employer] Death cert is ready for pick up. Placed up front. Nothing further needed.

## 2017-10-26 NOTE — Progress Notes (Signed)
Rapid Response Event Note  Overview: Rapid response called to room 106      Initial Focused Assessment: Pt found lying in bed, unresponsive   Interventions: code blue called  Plan of Care (if not transferred): pt intubated and  transferred to ICU bed 20  Event Summary: Upon transfer, family notified.  Pt remains unresponsive, intubated.  See code blue code sheet for further details.   at      at          [facility] R

## 2017-10-26 NOTE — ED Notes (Signed)
Called floor to let them know patient on the way 

## 2017-10-26 NOTE — H&P (Signed)
History and Physical   [facility] - [place] @ [facility] Admission History and Physical [employer], D.O.    Patient Name: Candice Randall MR#: [id number] Date of Birth: 08-23-1946 Date of Admission: 10/23/2017  Referring MD/NP/PA: Dr. Darnelle Catalan Primary Care Physician: Clovis Pu, Arturo Morton, DO  Chief Complaint:  Chief Complaint  Patient presents with  . Altered Mental Status  . Abdominal Pain  Please note the entire history is obtained from the patient's emergency department chart, emergency department provider and prior records. Patient's personal history is limited by dementia and altered mental status.   HPI: Candice Randall is a 71 y.o. female with a known history of COPD, dementia, hyperlipidemia, vitamin D deficiency presents to the emergency department for evaluation of small bowel obstruction which was diagnosed at her care facility 2 days ago. Patient reportedly complained of diffuse abdominal pain described as severe.  On my exam patient is somnolent, arousable to painful stimuli, makes eye contact but does not communicate or follow commands, possibly secondary to medication  EMS/ED Course: Patient received morphine 2 mg 2, Zofran. Medical admission has been requested for further management of altered mental status, hypercalcemia.  Review of Systems:  Unable to obtain secondary to altered mental status, dementia.   Past Medical History:  Diagnosis Date  . COPD (chronic obstructive pulmonary disease) (HCC)   . Dementia   . Hyperlipidemia   . Vitamin D deficiency     Past Surgical History:  Procedure Laterality Date  . NO PAST SURGERIES       reports that she has been smoking.  She has never used smokeless tobacco. She reports that she does not drink alcohol or use drugs.  No Known Allergies  Family History  Problem Relation Age of Onset  . Family history unknown: Yes    Prior to Admission medications   Medication Sig Start Date End Date Taking?  Authorizing Provider  acetaminophen (TYLENOL) 500 MG tablet Take 500 mg by mouth 2 (two) times daily.   Yes [provider]  aspirin EC 81 MG tablet Take 81 mg by mouth daily.   Yes [provider]  benzonatate (TESSALON) 200 MG capsule Take 1 capsule (200 mg total) by mouth 3 (three) times daily as needed for cough. 03/29/17  Yes Gouru, Deanna Artis, MD  cholecalciferol (VITAMIN D) 1000 units tablet Take 1,000 Units by mouth daily.   Yes [provider]  colchicine 0.6 MG tablet Take 0.6 mg by mouth 2 (two) times daily.   Yes [provider]  donepezil (ARICEPT) 5 MG tablet Take 5 mg by mouth at bedtime.   Yes [provider]  ferrous sulfate 325 (65 FE) MG tablet Take 325 mg by mouth daily with breakfast.   Yes [provider]  folic acid (FOLVITE) 1 MG tablet Take 1 mg by mouth daily.   Yes [provider]  loratadine (CLARITIN) 10 MG tablet Take 10 mg by mouth daily.   Yes [provider]  LORazepam (ATIVAN) 0.5 MG tablet Take 1 tablet (0.5 mg total) by mouth 2 (two) times daily as needed for anxiety. 03/29/17  Yes Gouru, Aruna, MD  LORazepam (ATIVAN) 0.5 MG tablet Take 0.5 mg by mouth 2 (two) times daily.   Yes [provider]  magnesium hydroxide (MILK OF MAGNESIA) 400 MG/5ML suspension Take 30 mLs by mouth daily as needed for mild constipation.   Yes [provider]  mirtazapine (REMERON) 7.5 MG tablet Take 7.5 mg by mouth at bedtime.  Yes [provider]  Neomycin-Bacitracin-Polymyxin (TRIPLE ANTIBIOTIC) [phone] OINT Apply 1 application topically daily. Apply to hand laceration and Band-Aid until healed.   Yes [provider]  polyethylene glycol (MIRALAX / GLYCOLAX) packet Take 17 g by mouth daily.   Yes [provider]  pravastatin (PRAVACHOL) 10 MG tablet Take 10 mg by mouth at bedtime.   Yes [provider]  QUEtiapine (SEROQUEL) 50 MG tablet Take 50 mg by mouth 2  (two) times daily.   Yes [provider]  albuterol (PROVENTIL) (2.5 MG/3ML) 0.083% nebulizer solution Take 3 mLs (2.5 mg total) by nebulization every 6 (six) hours as needed for wheezing or shortness of breath. Patient not taking: Reported on 09/27/2017 03/29/17   Ramonita Lab, MD  AMBULATORY NON FORMULARY MEDICATION Medication Name: dispense nebulizer machine Dx:J44.9 Patient not taking: Reported on 09/27/2017 06/15/17   Erin Fulling, MD  budesonide (PULMICORT) 0.5 MG/2ML nebulizer solution Take 2 mLs (0.5 mg total) by nebulization 2 (two) times daily. DX.J44.1 Patient not taking: Reported on 09/27/2017 06/08/17 06/08/18  Erin Fulling, MD    Physical Exam: Vitals:   09/30/2017 2214 10/02/2017 2300 10/25/2017 2330 2017-10-29 0000  BP:      Pulse: (!) 104     Resp:  Temp:      TempSrc:      SpO2: 100%     Weight:      Height:        GENERAL: 71 y.o.-year-old Female patient, well-developed, well-nourished lying in the bed in no acute distress.     HEENT: Head atraumatic, normocephalic. Pupils equal. Mucus membranes moist. NECK: Supple,  CHEST: Normal breath sounds bilaterally. No wheezing, rales, rhonchi or crackles. No use of accessory muscles of respiration.  No reproducible chest wall tenderness.  CARDIOVASCULAR: S1, S2 normal. No murmurs, rubs, or gallops. Cap refill <2 seconds. Pulses intact distally.  ABDOMEN: Soft, nondistended, mild tenderness to left lower quadrant. No rebound, guarding, rigidity. Normoactive bowel sounds present in all four quadrants.  EXTREMITIES: No pedal edema, cyanosis, or clubbing. No calf tenderness or Homan's sign.  NEUROLOGIC: The patient is somnolent but arousable. Cannot follow commands. SKIN: Warm, dry, and intact without obvious rash, lesion, or ulcer.    Labs on Admission:  CBC:  Recent Labs Lab 10/24/2017 2047  WBC 11.5*  NEUTROABS 8.1*  HGB 11.6*  HCT 35.0  MCV 85.6  PLT 244   Basic Metabolic Panel:  Recent Labs Lab  10/19/2017 2047  NA 145  K 4.8  CL 109  CO2 25  GLUCOSE 124*  BUN 33*  CREATININE 1.37*  CALCIUM 13.4*   GFR: Estimated Creatinine Clearance: 32.9 mL/min (A) (by C-G formula based on SCr of 1.37 mg/dL (H)). Liver Function Tests:  Recent Labs Lab 10/25/2017 2047  AST 36  ALT 16  ALKPHOS 193*  BILITOT 0.5  PROT 8.6*  ALBUMIN 3.4*    Recent Labs Lab 10/24/2017 2047  LIPASE 21   No results for input(s): AMMONIA in the last 168 hours. Coagulation Profile: No results for input(s): INR, PROTIME in the last 168 hours. Cardiac Enzymes:  Recent Labs Lab 10/18/2017 2047  TROPONINI <0.03   BNP (last 3 results) No results for input(s): PROBNP in the last 8760 hours. HbA1C: No results for input(s): HGBA1C in the last 72 hours. CBG: No results for input(s): GLUCAP in the last 168 hours. Lipid Profile: No results for input(s): CHOL, HDL, LDLCALC, TRIG, CHOLHDL, LDLDIRECT in the last 72 hours. Thyroid Function  Tests: No results for input(s): TSH, T4TOTAL, FREET4, T3FREE, THYROIDAB in the last 72 hours. Anemia Panel: No results for input(s): VITAMINB12, FOLATE, FERRITIN, TIBC, IRON, RETICCTPCT in the last 72 hours. Urine analysis:    Component Value Date/Time   COLORURINE YELLOW (A) 10/07/2017 2047   APPEARANCEUR HAZY (A) Oct 06, 2017 2047   LABSPEC 1.015 10/13/2017 2047   PHURINE 5.0 October 06, 2017 2047   GLUCOSEU NEGATIVE 10/04/2017 2047   HGBUR NEGATIVE 10/24/2017 2047   BILIRUBINUR NEGATIVE October 06, 2017 2047   KETONESUR NEGATIVE 10/04/2017 2047   PROTEINUR NEGATIVE 09/30/2017 2047   NITRITE NEGATIVE 10/22/2017 2047   LEUKOCYTESUR NEGATIVE Oct 06, 2017 2047   Sepsis Labs: (procalcitonin:4,lacticidven:4) )No results found for this or any previous visit (from the past 240 hour(s)).   Radiological Exams on Admission: Ct Head Wo Contrast  Result Date: 10/15/2017 CLINICAL DATA:  Initial evaluation for acute altered mental status. EXAM: CT HEAD WITHOUT CONTRAST  TECHNIQUE: Contiguous axial images were obtained from the base of the skull through the vertex without intravenous contrast. COMPARISON:  [facility] CT from 09/27/2017. FINDINGS: Brain: Moderate cerebral atrophy with chronic small vessel ischemic disease. No acute intracranial hemorrhage. No acute large vessel territory infarct. No mass lesion, midline shift or mass effect. Ventricular prominence related global parenchymal volume loss without hydrocephalus. No extra-axial fluid collection. Vascular: No hyperdense vessel. Scattered vascular calcifications noted within the carotid siphons. Skull: Scalp soft tissues and calvarium within normal limits. Sinuses/Orbits: Globes and orbital soft tissues within normal limits. Paranasal sinuses and mastoid air cells are clear. Other: None. IMPRESSION: 1. No acute intracranial abnormality. 2. Moderate atrophy with chronic small vessel ischemic disease, stable. Electronically Signed   By: Rise Mu M.D.   On: October 06, 2017 23:09   Ct Abdomen Pelvis W Contrast  Result Date: 10/02/2017 CLINICAL DATA:  Abdominal pain, not acting RIGHT, confusion, dementia, COPD EXAM: CT ABDOMEN AND PELVIS WITH CONTRAST TECHNIQUE: Multidetector CT imaging of the abdomen and pelvis was performed using the standard protocol following bolus administration of intravenous contrast. Sagittal and coronal MPR images reconstructed from axial data set. CONTRAST:  75mL ISOVUE-300 IOPAMIDOL (ISOVUE-300) INJECTION 61% IV. No oral contrast. COMPARISON:  06/24/2011 FINDINGS: Lower chest: RIGHT basilar atelectasis. Lung abscess seen at RIGHT lung base on the previous exam no longer identified. Hepatobiliary: Gallbladder and liver normal appearance Pancreas: Pancreatic atrophy without gross mass Spleen: Absent spleen Adrenals/Urinary Tract: RIGHT adrenal mass again identified 18 x 11 mm, demonstrating washout on delayed images consistent with adrenal adenoma. LEFT adrenal gland unremarkable. Kidneys,  ureters, and bladder normal appearance Stomach/Bowel: Normal appendix. Stomach suboptimally distended. Bowel loops grossly unremarkable. Vascular/Lymphatic: Atherosclerotic calcifications aorta and iliac arteries with aneurysmal dilatation of mid to distal abdominal aorta 3.4 x 3.1 cm image 39. Scattered pelvic phleboliths. No abdominal or pelvic adenopathy. Reproductive: Uterus surgically absent.  Unremarkable ovaries. Other: No free air free fluid.  No hernia. Musculoskeletal: Diffuse osseous demineralization. Cystic changes at the RIGHT femoral head again seen. Degenerative changes of both hip joints. No acute osseous findings. IMPRESSION: 3.4 x 3.1 cm mid to distal abdominal aortic aneurysm increased since 2012. Recommend followup by [place] in 3 years. This recommendation follows ACR consensus guidelines: White Paper of the ACR Incidental Findings Committee II on Vascular Findings. [place] [place] Coll Radiol 2013; 10:789-794 RIGHT adrenal adenoma. No acute intra-abdominal or intrapelvic abnormalities. Electronically Signed   By: Ulyses Southward M.D.   On: 10/14/2017 23:13    EKG: Normal sinus rhythm at 91 bpm with normal axis and nonspecific ST-T wave changes.  Assessment/Plan  This is a 71 y.o. female with a history of COPD, dementia, hyperlipidemia, vitamin D deficiency now being admitted with:  #. Altered mental status, unclear etiology. Possibly secondary to hypercalcemia which is relatively new onset Admit to inpatient Monitor on telemetry Gentle IV fluid hydration Check PTH, ionized calcium and vitamin D Repeat BMP Follow up cultures  #. Lactic acidosis Gentle IV fluid hydration Trend lactate  #. History of COPD O2 and Med-Neb therapy as needed  #. History of hyperlipidemia - Hold pravastatin until can take PO  #. History of dementia - Hold meds Seroquel, Aricept  Admission status: Inpatient IV Fluids: NS Diet/Nutrition: NPO Consults called: None  DVT Px: Lovenox, SCDs and early  ambulation. Code Status: Full Code  Disposition Plan: To home in 2-3 days  All the records are reviewed and case discussed with ED provider. Management plans discussed with the patient and/or family who express understanding and agree with plan of care.  Malayzia Laforte D.O. on 10/07/2017 at 12:39 AM Between 7am to 6pm - Pager - [phone] After 6pm go to [email or web address] - Administrator, Civil Service [facility] [place] [facility] Office [phone] CC: Primary care physician; Clovis Pu, Arturo Morton, DO   10/19/2017, 12:39 AM

## 2017-10-26 NOTE — Consult Note (Signed)
PULMONARY / CRITICAL CARE MEDICINE   Name: Candice Randall MRN: [id number] DOB: 1946/10/23    ADMISSION DATE:  10/20/2017  CONSULTATION DATE:  October 10, 2017  REFERRING MD:  Dr. Tobi Bastos  CHIEF COMPLAINT:  PEA ARREST  HISTORY OF PRESENT ILLNESS:   Candice Randall is a 71 year old female with known history of COPD,Dementia,Hyperlipidemia and Vitamin D deficiency.  Patient was admitted with altered mental status  possibly secondary to hypercalcemia.  Patient was admitted to the floor.  Patient coded on the floor.  ACLS Protocol carried out.  Patient lost his pulse multiple times , shocked X2 .  ACLS measures were approximately for about 40 minutes.  Patient was intubated during the code and is sent to the ICU for further management.  Patient is critically ill.  Dr. Tobi Bastos called family member and informed regarding the status of the patient.  Family would like to proceed with full aggressive care.  PAST MEDICAL HISTORY :  She  has a past medical history of COPD (chronic obstructive pulmonary disease) (HCC); Dementia; Hyperlipidemia; and Vitamin D deficiency.  PAST SURGICAL HISTORY: She  has a past surgical history that includes No past surgeries.  No Known Allergies  No current facility-administered medications on file prior to encounter.    Current Outpatient Prescriptions on File Prior to Encounter  Medication Sig  . acetaminophen (TYLENOL) 500 MG tablet Take 500 mg by mouth 2 (two) times daily.  Marland Kitchen aspirin EC 81 MG tablet Take 81 mg by mouth daily.  . benzonatate (TESSALON) 200 MG capsule Take 1 capsule (200 mg total) by mouth 3 (three) times daily as needed for cough.  . cholecalciferol (VITAMIN D) 1000 units tablet Take 1,000 Units by mouth daily.  . colchicine 0.6 MG tablet Take 0.6 mg by mouth 2 (two) times daily.  Marland Kitchen donepezil (ARICEPT) 5 MG tablet Take 5 mg by mouth at bedtime.  . ferrous sulfate 325 (65 FE) MG tablet Take 325 mg by mouth daily with breakfast.  . folic acid (FOLVITE) 1  MG tablet Take 1 mg by mouth daily.  Marland Kitchen loratadine (CLARITIN) 10 MG tablet Take 10 mg by mouth daily.  Marland Kitchen LORazepam (ATIVAN) 0.5 MG tablet Take 1 tablet (0.5 mg total) by mouth 2 (two) times daily as needed for anxiety.  Marland Kitchen LORazepam (ATIVAN) 0.5 MG tablet Take 0.5 mg by mouth 2 (two) times daily.  . mirtazapine (REMERON) 7.5 MG tablet Take 7.5 mg by mouth at bedtime.  Marland Kitchen Neomycin-Bacitracin-Polymyxin (TRIPLE ANTIBIOTIC) [phone] OINT Apply 1 application topically daily. Apply to hand laceration and Band-Aid until healed.  . pravastatin (PRAVACHOL) 10 MG tablet Take 10 mg by mouth at bedtime.  Marland Kitchen QUEtiapine (SEROQUEL) 50 MG tablet Take 50 mg by mouth 2 (two) times daily.  Marland Kitchen albuterol (PROVENTIL) (2.5 MG/3ML) 0.083% nebulizer solution Take 3 mLs (2.5 mg total) by nebulization every 6 (six) hours as needed for wheezing or shortness of breath. (Patient not taking: Reported on 09/27/2017)  . AMBULATORY NON FORMULARY MEDICATION Medication Name: dispense nebulizer machine Dx:J44.9 (Patient not taking: Reported on 09/27/2017)  . budesonide (PULMICORT) 0.5 MG/2ML nebulizer solution Take 2 mLs (0.5 mg total) by nebulization 2 (two) times daily. DX.J44.1 (Patient not taking: Reported on 09/27/2017)    FAMILY HISTORY:  Her has no family status information on file.    SOCIAL HISTORY: She  reports that she has been smoking.  She has never used smokeless tobacco. She reports that she does not drink alcohol or use drugs.  REVIEW OF SYSTEMS:  Unable to obtain  SUBJECTIVE:  Unable to obtain  VITAL SIGNS: BP (!) 60/33   Pulse 87   Temp (!) 97.3 F (36.3 C) (Oral)   Resp (!) 25   Ht  (1.651 m)   Wt 63.5 kg (140 lb)   LMP  (LMP Unknown) Comment: unknown  SpO2 100%   BMI 23.30 kg/m   HEMODYNAMICS:    VENTILATOR SETTINGS:    INTAKE / OUTPUT: No intake/output data recorded.  PHYSICAL EXAMINATION: General:  Cachectic, critically ill, intubated and on mechanical ventilation Neuro: unable  to obtain HEENT:  AT,, Loose tooth from intubation Cardiovascular:irregularly irregular, no m/r/g Lungs:diminished. No wheezes,crackles,rhonchi Abdomen:  Soft, hypoactive bowel sounds Musculoskeletal:  No edema,cyanosis Skin: warm,dry and intact  LABS:  BMET  Recent Labs Lab 10/08/17 2047  NA 145  K 4.8  CL 109  CO2 25  BUN 33*  CREATININE 1.37*  GLUCOSE 124*    Electrolytes  Recent Labs Lab 10/08/2017 2047  CALCIUM 13.4*    CBC  Recent Labs Lab Oct 08, 2017 2047  WBC 11.5*  HGB 11.6*  HCT 35.0  PLT 244    Coag's No results for input(s): APTT, INR in the last 168 hours.  Sepsis Markers  Recent Labs Lab 10/08/2017 2047 10/14/2017 0007  LATICACIDVEN 3.2* 1.5    ABG  Recent Labs Lab 10/08/17 2116  PHART 7.44  PCO2ART 37  PO2ART 131*    Liver Enzymes  Recent Labs Lab 10-08-2017 2047  AST 36  ALT 16  ALKPHOS 193*  BILITOT 0.5  ALBUMIN 3.4*    Cardiac Enzymes  Recent Labs Lab October 08, 2017 2047  TROPONINI <0.03    Glucose No results for input(s): GLUCAP in the last 168 hours.  Imaging Ct Head Wo Contrast  Result Date: Oct 08, 2017 CLINICAL DATA:  Initial evaluation for acute altered mental status. EXAM: CT HEAD WITHOUT CONTRAST TECHNIQUE: Contiguous axial images were obtained from the base of the skull through the vertex without intravenous contrast. COMPARISON:  [facility] CT from 09/27/2017. FINDINGS: Brain: Moderate cerebral atrophy with chronic small vessel ischemic disease. No acute intracranial hemorrhage. No acute large vessel territory infarct. No mass lesion, midline shift or mass effect. Ventricular prominence related global parenchymal volume loss without hydrocephalus. No extra-axial fluid collection. Vascular: No hyperdense vessel. Scattered vascular calcifications noted within the carotid siphons. Skull: Scalp soft tissues and calvarium within normal limits. Sinuses/Orbits: Globes and orbital soft tissues within normal limits. Paranasal  sinuses and mastoid air cells are clear. Other: None. IMPRESSION: 1. No acute intracranial abnormality. 2. Moderate atrophy with chronic small vessel ischemic disease, stable. Electronically Signed   By: Rise Mu M.D.   On: 2017/10/08 23:09   Ct Abdomen Pelvis W Contrast  Result Date: 10-08-17 CLINICAL DATA:  Abdominal pain, not acting RIGHT, confusion, dementia, COPD EXAM: CT ABDOMEN AND PELVIS WITH CONTRAST TECHNIQUE: Multidetector CT imaging of the abdomen and pelvis was performed using the standard protocol following bolus administration of intravenous contrast. Sagittal and coronal MPR images reconstructed from axial data set. CONTRAST:  75mL ISOVUE-300 IOPAMIDOL (ISOVUE-300) INJECTION 61% IV. No oral contrast. COMPARISON:  06/24/2011 FINDINGS: Lower chest: RIGHT basilar atelectasis. Lung abscess seen at RIGHT lung base on the previous exam no longer identified. Hepatobiliary: Gallbladder and liver normal appearance Pancreas: Pancreatic atrophy without gross mass Spleen: Absent spleen Adrenals/Urinary Tract: RIGHT adrenal mass again identified 18 x 11 mm, demonstrating washout on delayed images consistent with adrenal adenoma. LEFT adrenal gland unremarkable. Kidneys, ureters, and bladder normal appearance Stomach/Bowel: Normal  appendix. Stomach suboptimally distended. Bowel loops grossly unremarkable. Vascular/Lymphatic: Atherosclerotic calcifications aorta and iliac arteries with aneurysmal dilatation of mid to distal abdominal aorta 3.4 x 3.1 cm image 39. Scattered pelvic phleboliths. No abdominal or pelvic adenopathy. Reproductive: Uterus surgically absent.  Unremarkable ovaries. Other: No free air free fluid.  No hernia. Musculoskeletal: Diffuse osseous demineralization. Cystic changes at the RIGHT femoral head again seen. Degenerative changes of both hip joints. No acute osseous findings. IMPRESSION: 3.4 x 3.1 cm mid to distal abdominal aortic aneurysm increased since 2012. Recommend  followup by [place] in 3 years. This recommendation follows ACR consensus guidelines: White Paper of the ACR Incidental Findings Committee II on Vascular Findings. [place] Coll Radiol 2013; 10:789-794 RIGHT adrenal adenoma. No acute intra-abdominal or intrapelvic abnormalities. Electronically Signed   By: Ulyses Southward M.D.   On: 10/23/2017 23:13     STUDIES:  10/11/2017 CT HEAD>>. No acute intracranial abnormality.. Moderate atrophy with chronic small vessel ischemic disease, Stable. 10/10/2017 CT abdomen>>No acute intra-abdominal or intrapelvic abnormalities   CULTURES: 10/14/2017 Urine culture>>  ANTIBIOTICS: none  SIGNIFICANT EVENTS: 10/12 Patient admitted with Altered mental status secondary to hypercalcemia 10/12 PEA arrest with ROSC Approximately after 40 minutes  LINES/TUBES: 26-Oct-2017 ET tube>> 26-Oct-2017 Right femoral  ASSESSMENT / PLAN:  PULMONARY A: Acute Respiratory failure Severe metabolic acidosis COPD  P:   Vent settings established VAP Bundle implemented NaHCO3 X3 NaHCO3 gtt CXR  Routine ABG Continue Bronchodilator  CARDIOVASCULAR A:  PEA Arrest Hx of Hyperlipidemia P:  Started on pressors Keep MAP GOAL>60 Obtain Echo Hold pravastatin Trend troponins  RENAL A:   Acute Kidney injury P:   Monitor I/O Follow BMP  GASTROINTESTINAL A:   Abdominal pain P:   CT abdomen 10/11 Negative Protonix for GI prophylaxis  HEMATOLOGIC A:   No active issues P:  Lovenox for DVT prophylaxis Will transfuse if Hgb7mg /dl  INFECTIOUS A:   Mild leukocytosis related to stress response P:   Monitor fever,cbc   ENDOCRINE A:   No active issues  P:   Follow Blood glucose intermittently with BMP  NEUROLOGIC A:   Acute Encephalopathy History of Dementia P:   RASS goal: 0 to -1 Patient is not a candidate for cooling CT of the head is needed when the patient is more stable   FAMILY  - Updates: Family is updated regarding the patient's condition.  Patient  is critically ill and the prognosis is guarded     Bincy Varughese,AG-ACNP Pulmonary and Critical Care Medicine [facility]   October 26, 2017, 4:52 AM

## 2017-10-26 NOTE — Procedures (Signed)
Central Venous Catheter Insertion Procedure Note- Right femoral Candice Randall [id number] 1946-01-15  Procedure: Insertion of Central Venous Catheter Indications: Assessment of intravascular volume, Drug and/or fluid administration and Frequent blood sampling  Procedure Details Consent: Unable to obtain consent because of emergent medical necessity. Time Out: Verified patient identification, verified procedure, site/side was marked, verified correct patient position, special equipment/implants available, medications/allergies/relevent history reviewed, required imaging and test results available.  Performed  Maximum sterile technique was used including antiseptics, cap, gloves, gown, hand hygiene, mask and sheet. Skin prep: Chlorhexidine; local anesthetic administered A antimicrobial bonded/coated triple lumen catheter was placed in the right femoral vein due to emergent situation using the Seldinger technique.  Evaluation Blood flow good Complications: No apparent complications Patient did tolerate procedure well.   Procedure performed under direct ultrasound guidance for real time vessel cannulation.      Sylvestre Rathgeber,AG-ACNP Pulmonary & Critical Care

## 2017-10-26 NOTE — Progress Notes (Addendum)
Code blue called Patient was having agonal breathing with decreased heart rate around 20/min Full ACLS protocol ran and patient intubated and put on ventilator by ER attending Pulse revived after cardiac resuscitation. Blood pressure low and patient started on iv levophed drip and given iv fluids bolus. Intensivist consult done and notified Patient transferred to icu and critical care nurse practitioner informed. Family informed regarding medical condition and treatment plan.

## 2017-10-26 NOTE — Progress Notes (Signed)
Patient arrived to room 106. Transferred patient from ED stretcher to bed. While removing linens from under patient, patient went unresponsive. Rapid response was called. Respiratory Therapist, Administrative Coordinator, Consulting civil engineer, RN assigned to patient, and other unit staff at bedside. Patient stopped breathing and a code was called. Hospitalist, ED Physician, and ICU Intensivist at bedside and ACLS initiated. Sister notified via voicemail by assigned RN. Daughter returned phone call and spoke with Hospitalist (Dr. Tobi Bastos). Patient transferred to ICU

## 2017-10-26 NOTE — Progress Notes (Signed)
  Spoke to [employer] ([facility]) .  She has agreed to make her a comfort care however Son is not in agreement with this decision.  Code status was changed to DNR at this time.    Bincy Varughese,AG-ACNP Pulmonary & Critical Care

## 2017-10-26 NOTE — Progress Notes (Addendum)
eLink Physician-Brief Progress Note Patient Name: Candice Randall DOB: [birth year] MRN: [id number]   Date of Service  10/12/2017  HPI/Events of Note  53 F with PMH  of COPD, dementia, hyperlipidemia, vitamin D deficiency presents to the emergency department for evaluation of small bowel obstruction  And diffuse abd pain.  Patient's LOC is also altered.  She is protecting her airway but does not follow commands.  She is arouesable to painful stimuli. These is some concern this may be medication induced. Currently she is hypotensive but receiving fluids.    Here lactate was inially 3.2 but this has decreased to 1.5.  She does have some AKI and Calcium is elevated at 13.4  Subsequently the patient was found even less responsive hypotensive with agonal breathing and was intubated.    eICU Interventions  Plan of care per admitting team. Vent/sedation orders Continue with IVF hydration Pressors for BP support as needed Continue to monitor via [facility]     Intervention Category Evaluation Type: New Patient Evaluation  Candice Randall 10/07/2017, 4:29 AM

## 2017-10-26 NOTE — Progress Notes (Signed)
1145 Body sent to [id number] Family from [place] arrived at hospital.

## 2017-10-26 NOTE — Progress Notes (Signed)
ABG results given to NP Bincy at 0455 pH 7.04, pCo2 49, pO2 140, HCO3 13.2. Pt on PRVC vT 450, RR 16, Peep 5, FIO2 .60. Unable to transmit in Epic at this time.

## 2017-10-26 NOTE — Death Summary Note (Signed)
71 year old female admitted to the hospital or to mental status and noted to be hypercalcemic. Patient was initially on the floor and noted to be pulseless overnight. Patient given CPR and admitted to the ICU after she was intubated. Family decided to make her DO NOT RESUSCITATE/DO NOT INTUBATE. Patient noted to her flat line at 7:28 AM. No doll's eyes movements. Pupils fixed and dilated. No heart sounds heard. Patient declared dead. Family notified.

## 2017-10-26 NOTE — Progress Notes (Addendum)
PT Cancellation Note  Patient Details Name: Candice Randall MRN: [id number] DOB: [birth year]   Cancelled Treatment:    Reason Eval/Treat Not Completed: Medical issues which prohibited therapy (Patient noted with decline in medical status, transfer to CCU with subsequent intubation.  Initial orders discontinued at this time; please re-consult as medically appropriate.)   Shavonne Ambroise H. Manson Passey, PT, DPT, NCS Nov 05, 2017, 7:40 AM [phone]

## 2017-10-26 NOTE — Progress Notes (Signed)
9528 Asystole noted on monitor per CCMD. Family with patient

## 2017-10-26 NOTE — Progress Notes (Signed)
Pt ETT pulled back 3cm per NP Bincy order. ETT secured 21cm at lip.

## 2017-10-26 DEATH — deceased

## 2018-02-01 IMAGING — CT CT HEAD W/O CM
3 of 6 series · 14 of 47 positions shown, 17 images · non-contrast
Comparison: None.

CLINICAL DATA: Unwitnessed fall from standing. Confusion. Abrasions
to the bridge of nose

EXAM:
CT HEAD WITHOUT CONTRAST
CT CERVICAL SPINE WITHOUT CONTRAST
TECHNIQUE: Multidetector CT imaging of the head and cervical spine was
performed following the standard protocol without intravenous
contrast. Multiplanar CT image reconstructions of the cervical spine
were also generated.

[Series 5: soft tissue · axial · 0.35mm/px · z∈[+300,+424]mm · 9 of 78 slices shown, 12 images]
[im 8/78  brain]
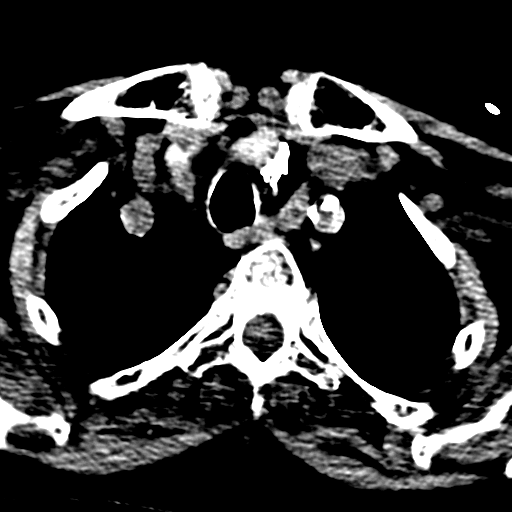
[im 8/78  bone]
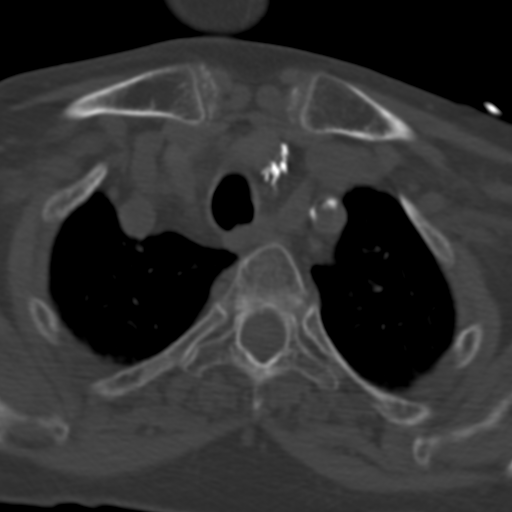
[im 15/78  brain]
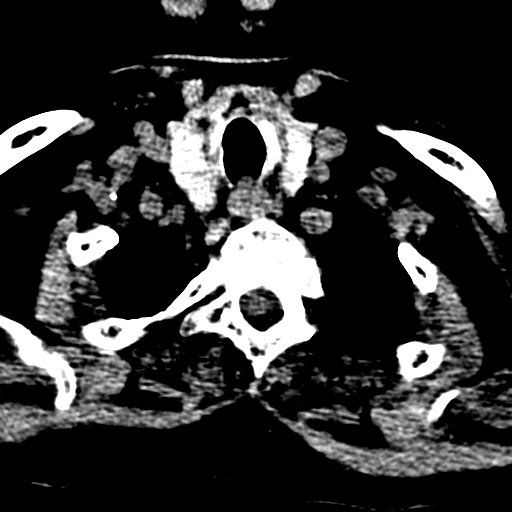
[im 23/78  brain]
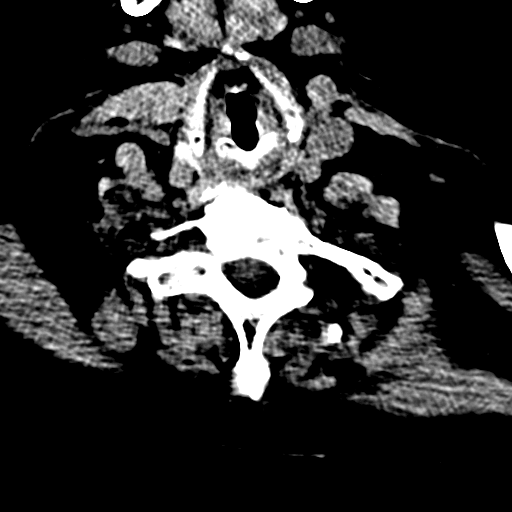
[im 30/78  brain]
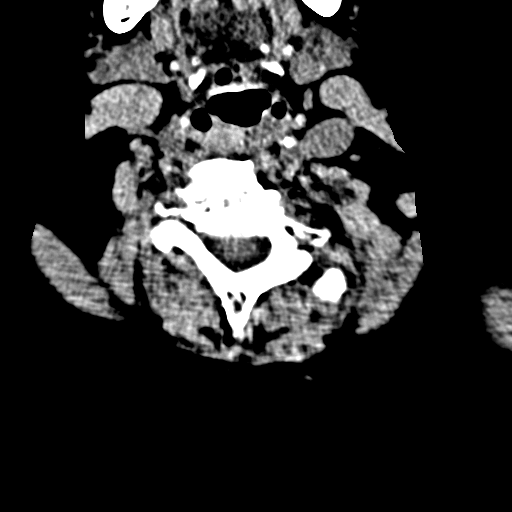
[im 41/78  brain]
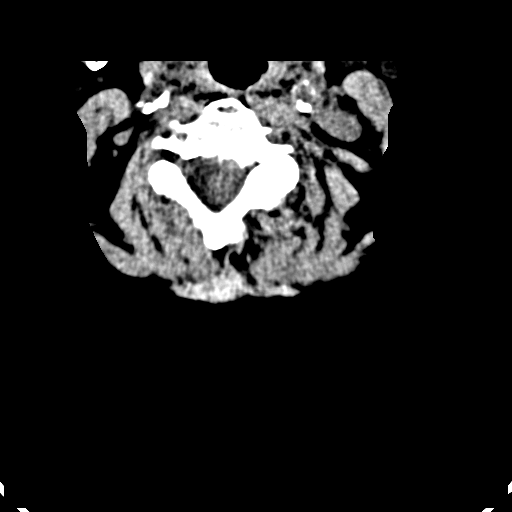
[im 41/78  bone]
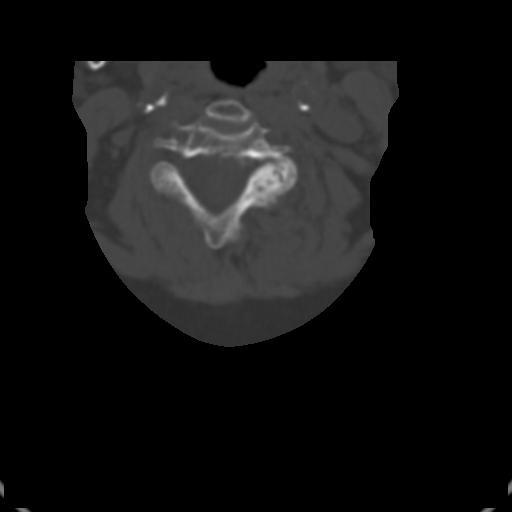
[im 48/78  brain]
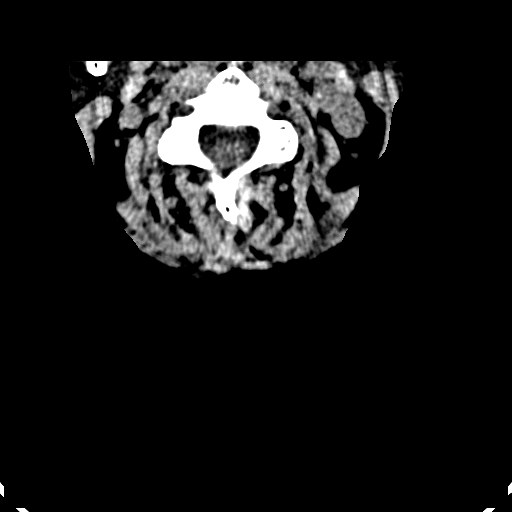
[im 56/78  brain]
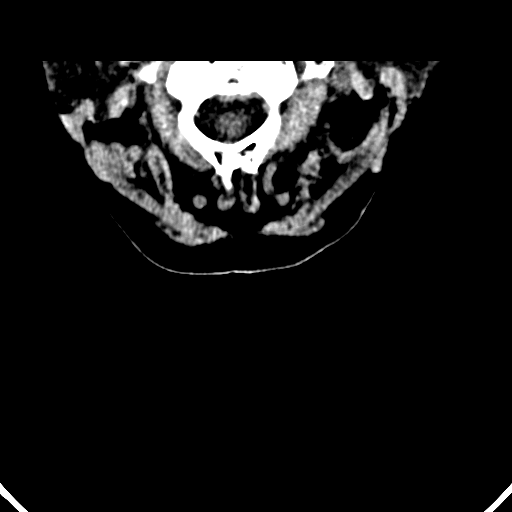
[im 63/78  brain]
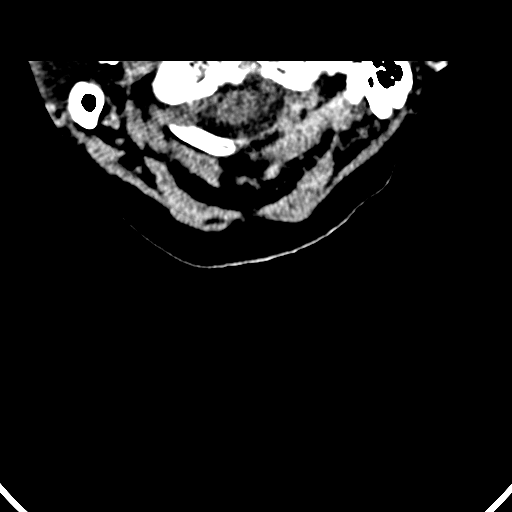
[im 70/78  brain]
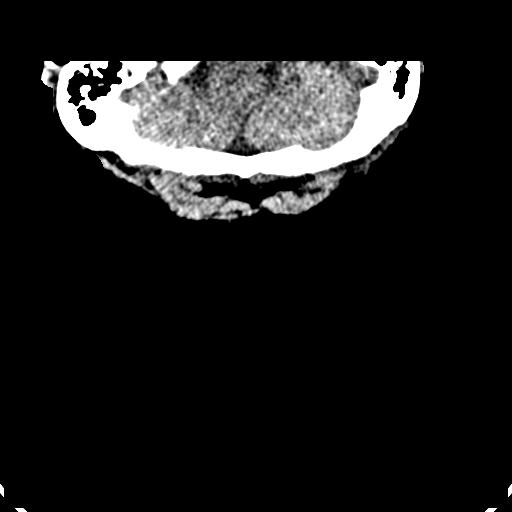
[im 70/78  bone]
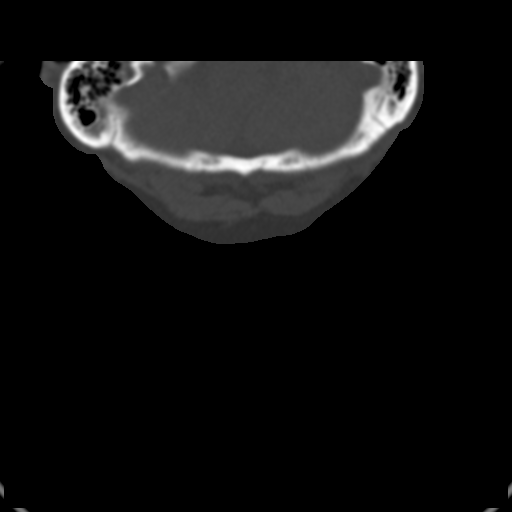

[Series 8: coronal soft tissue · coronal · 0.27mm/px · 3 of 66 slices shown]
[im 22/66  brain]
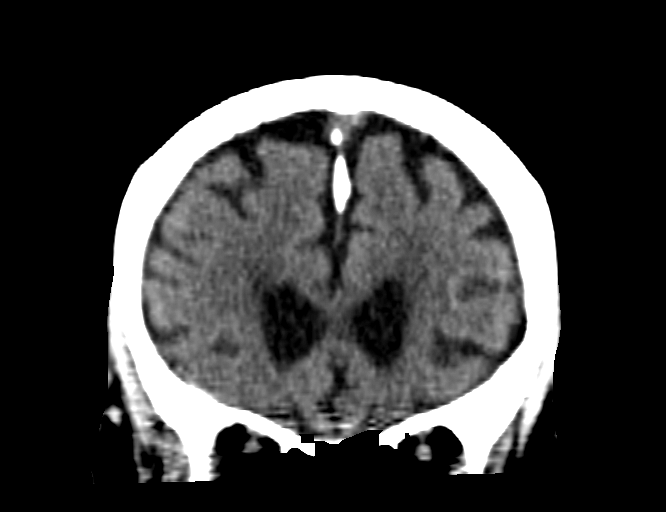
[im 33/66  brain]
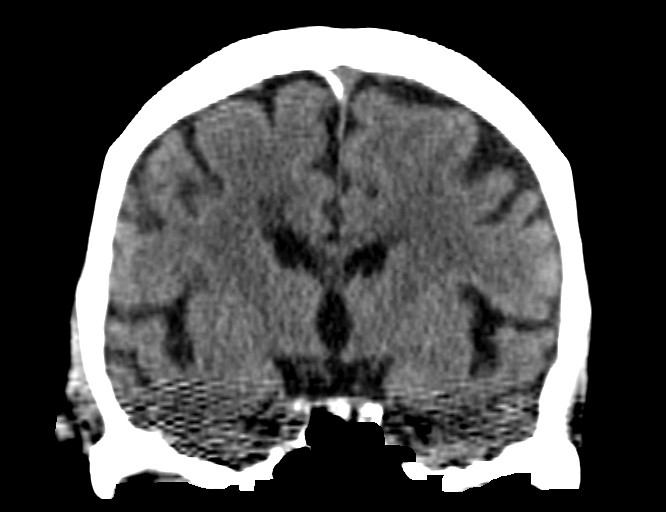
[im 44/66  brain]
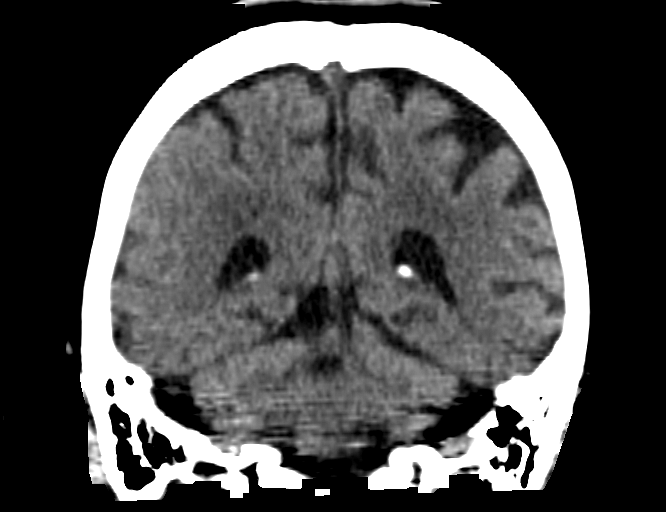

[Series 9: sagittal soft tissue · sagittal · 0.28mm/px · 2 of 54 slices shown]
[im 18/54  brain]
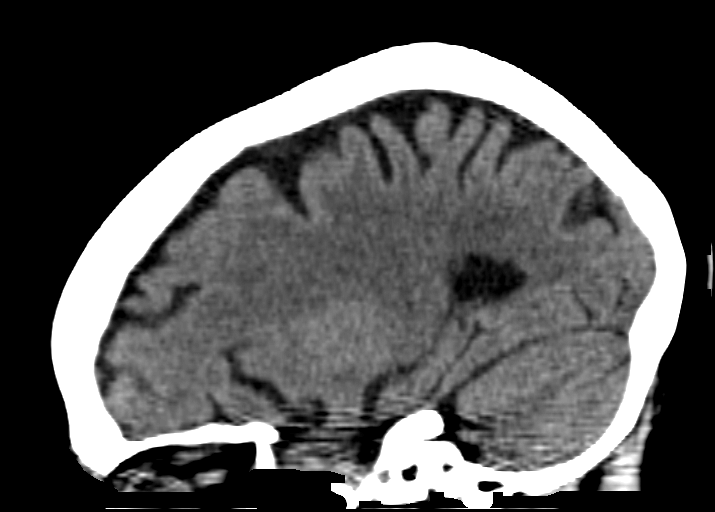
[im 36/54  brain]
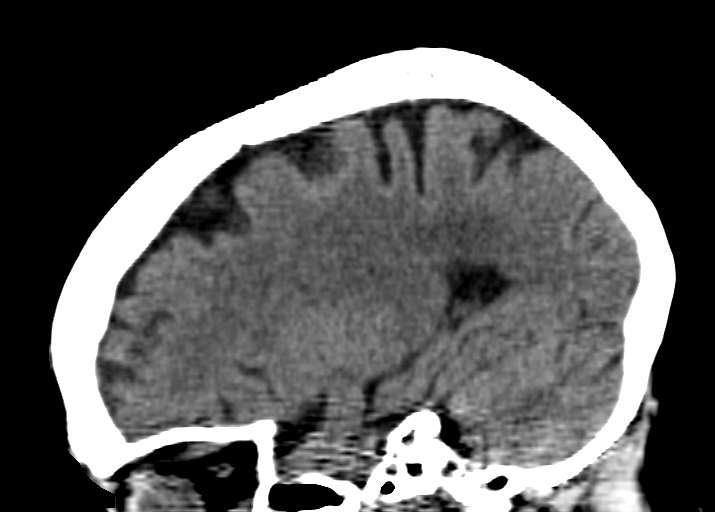

[14 of 47 positions shown; findings below may reference images not displayed]

FINDINGS: CT HEAD FINDINGS

There is no evidence for acute hemorrhage, hydrocephalus, mass
lesion, or abnormal extra-axial fluid collection. No definite CT
evidence for acute infarction. Diffuse loss of parenchymal volume is
consistent with atrophy. Patchy low attenuation in the deep
hemispheric and periventricular white matter is nonspecific, but
likely reflects chronic microvascular ischemic demyelination. The
visualized paranasal sinuses and mastoid air cells are clear. No
evidence for skull fracture.

CT CERVICAL SPINE FINDINGS

Imaging is obtained from the skullbase through the T2 vertebral
body. Patient with incomplete posterior arch at C1. No evidence of
fracture. Loss of disc height with endplate degeneration is seen at
C4-5, C5-6, and C6-7. Trace anterolisthesis of C5 on 6 is compatible
with the degree of facet disease at this level, left greater than
right. There is prominent left-sided uncinate spurring at C4-5 and
C5-6. Reversal of the normal cervical lordosis is evident. No
prevertebral soft tissue edema. 13 mm exophytic thyroid nodule
identified posterior right thyroid lobe.
IMPRESSION: 1. No acute intracranial abnormality.
2. Atrophy with chronic small vessel white matter ischemic disease.
3. Degenerative changes in the midcervical spine without evidence
for cervical spine fracture.
4. 13 mm posterior right thyroid nodule may not be clinically
relevant in this individual. Thyroid ultrasound could be used to
further evaluate as clinically warranted.
